# Patient Record
Sex: Male | Born: 1970 | Race: White | Hispanic: No | Marital: Married | Smoking: Never smoker
Health system: Southern US, Community
[De-identification: ages and names within clinical notes are randomized; demographics above are authoritative.]

## PROBLEM LIST (undated history)

## (undated) DIAGNOSIS — K219 Gastro-esophageal reflux disease without esophagitis: Secondary | ICD-10-CM

## (undated) DIAGNOSIS — Z8489 Family history of other specified conditions: Secondary | ICD-10-CM

## (undated) DIAGNOSIS — F32A Depression, unspecified: Secondary | ICD-10-CM

## (undated) DIAGNOSIS — T7840XA Allergy, unspecified, initial encounter: Secondary | ICD-10-CM

## (undated) DIAGNOSIS — K5792 Diverticulitis of intestine, part unspecified, without perforation or abscess without bleeding: Secondary | ICD-10-CM

## (undated) DIAGNOSIS — F419 Anxiety disorder, unspecified: Secondary | ICD-10-CM

## (undated) HISTORY — DX: Anxiety disorder, unspecified: F41.9

## (undated) HISTORY — PX: OTHER SURGICAL HISTORY: SHX169

## (undated) HISTORY — DX: Gastro-esophageal reflux disease without esophagitis: K21.9

## (undated) HISTORY — PX: COLONOSCOPY: SHX174

## (undated) HISTORY — DX: Allergy, unspecified, initial encounter: T78.40XA

---

## 2004-04-05 DIAGNOSIS — K589 Irritable bowel syndrome without diarrhea: Secondary | ICD-10-CM | POA: Insufficient documentation

## 2007-07-31 ENCOUNTER — Emergency Department (HOSPITAL_BASED_OUTPATIENT_CLINIC_OR_DEPARTMENT_OTHER): Admission: EM | Admit: 2007-07-31 | Discharge: 2007-07-31 | Payer: Self-pay | Admitting: Emergency Medicine

## 2008-10-03 ENCOUNTER — Encounter: Admission: RE | Admit: 2008-10-03 | Discharge: 2008-10-03 | Payer: Self-pay | Admitting: Family Medicine

## 2009-09-15 ENCOUNTER — Emergency Department (HOSPITAL_BASED_OUTPATIENT_CLINIC_OR_DEPARTMENT_OTHER): Admission: EM | Admit: 2009-09-15 | Discharge: 2009-09-16 | Payer: Self-pay | Admitting: Emergency Medicine

## 2009-09-16 ENCOUNTER — Ambulatory Visit: Payer: Self-pay | Admitting: Diagnostic Radiology

## 2010-04-16 LAB — DIFFERENTIAL
Basophils Relative: 3 % — ABNORMAL HIGH (ref 0–1)
Eosinophils Relative: 1 % (ref 0–5)
Lymphocytes Relative: 22 % (ref 12–46)
Monocytes Absolute: 0.5 10*3/uL (ref 0.1–1.0)
Monocytes Relative: 8 % (ref 3–12)
Neutrophils Relative %: 66 % (ref 43–77)

## 2010-04-16 LAB — BASIC METABOLIC PANEL
Creatinine, Ser: 1.1 mg/dL (ref 0.4–1.5)
GFR calc Af Amer: >60 mL/min (ref >60)
GFR calc non Af Amer: >60 mL/min (ref >60)
Sodium: 143 mEq/L (ref 135–145)

## 2010-04-16 LAB — CBC
MCH: 32.6 pg (ref 26.0–34.0)
RBC: 4.68 MIL/uL (ref 4.22–5.81)

## 2010-10-28 LAB — URINALYSIS, ROUTINE W REFLEX MICROSCOPIC
Glucose, UA: NEGATIVE
Ketones, ur: NEGATIVE
Nitrite: NEGATIVE
Protein, ur: NEGATIVE
Specific Gravity, Urine: 1.009
pH: 6

## 2011-09-20 ENCOUNTER — Encounter (HOSPITAL_BASED_OUTPATIENT_CLINIC_OR_DEPARTMENT_OTHER): Payer: Self-pay | Admitting: Emergency Medicine

## 2011-09-20 ENCOUNTER — Emergency Department (HOSPITAL_COMMUNITY): Payer: BC Managed Care – PPO

## 2011-09-20 ENCOUNTER — Emergency Department (HOSPITAL_BASED_OUTPATIENT_CLINIC_OR_DEPARTMENT_OTHER)
Admission: EM | Admit: 2011-09-20 | Discharge: 2011-09-20 | Disposition: A | Discharge status: Home / Self Care | Payer: BC Managed Care – PPO | Attending: Emergency Medicine | Admitting: Emergency Medicine

## 2011-09-20 ENCOUNTER — Emergency Department (HOSPITAL_BASED_OUTPATIENT_CLINIC_OR_DEPARTMENT_OTHER): Payer: BC Managed Care – PPO

## 2011-09-20 DIAGNOSIS — Z8719 Personal history of other diseases of the digestive system: Secondary | ICD-10-CM | POA: Insufficient documentation

## 2011-09-20 DIAGNOSIS — R209 Unspecified disturbances of skin sensation: Secondary | ICD-10-CM | POA: Insufficient documentation

## 2011-09-20 DIAGNOSIS — R079 Chest pain, unspecified: Secondary | ICD-10-CM | POA: Insufficient documentation

## 2011-09-20 HISTORY — DX: Diverticulitis of intestine, part unspecified, without perforation or abscess without bleeding: K57.92

## 2011-09-20 LAB — CBC WITH DIFFERENTIAL/PLATELET
Basophils Absolute: 0 10*3/uL (ref 0.0–0.1)
Eosinophils Absolute: 0 10*3/uL (ref 0.0–0.7)
Eosinophils Relative: 1 % (ref 0–5)
HCT: 42.8 % (ref 39.0–52.0)
MCH: 31.4 pg (ref 26.0–34.0)
Monocytes Absolute: 0.6 10*3/uL (ref 0.1–1.0)
Monocytes Relative: 12 % (ref 3–12)
Platelets: 272 10*3/uL (ref 150–400)
RDW: 12.6 % (ref 11.5–15.5)
WBC: 5 10*3/uL (ref 4.0–10.5)

## 2011-09-20 LAB — COMPREHENSIVE METABOLIC PANEL
ALT: 26 U/L (ref 0–53)
AST: 22 U/L (ref 0–37)
BUN: 13 mg/dL (ref 6–23)
CO2: 27 mEq/L (ref 19–32)
Calcium: 9.6 mg/dL (ref 8.4–10.5)
Chloride: 104 mEq/L (ref 96–112)
Creatinine, Ser: 0.9 mg/dL (ref 0.50–1.35)
Potassium: 4 mEq/L (ref 3.5–5.1)

## 2011-09-20 LAB — TROPONIN I: Troponin I: <0.3 ng/mL (ref <0.30)

## 2011-09-20 LAB — POCT I-STAT TROPONIN I

## 2011-09-20 MED ORDER — NITROGLYCERIN 0.4 MG SL SUBL
0.4000 mg | SUBLINGUAL_TABLET | SUBLINGUAL | Status: DC | PRN
  Administered 2011-09-20: 0.4 mg via SUBLINGUAL
  Filled 2011-09-20: qty 25

## 2011-09-20 MED ORDER — METOPROLOL TARTRATE 1 MG/ML IV SOLN
INTRAVENOUS | Status: AC
  Filled 2011-09-20: qty 10

## 2011-09-20 MED ORDER — METOPROLOL TARTRATE 1 MG/ML IV SOLN
2.5000 mg | Freq: Once | INTRAVENOUS | Status: AC
  Administered 2011-09-20: 2.5 mg via INTRAVENOUS

## 2011-09-20 MED ORDER — NITROGLYCERIN 0.4 MG SL SUBL
0.4000 mg | SUBLINGUAL_TABLET | Freq: Once | SUBLINGUAL | Status: AC
  Administered 2011-09-20: 0.4 mg via SUBLINGUAL

## 2011-09-20 MED ORDER — METOPROLOL TARTRATE 50 MG PO TABS
50.0000 mg | ORAL_TABLET | Freq: Once | ORAL | Status: AC
  Administered 2011-09-20: 50 mg via ORAL
  Filled 2011-09-20: qty 1

## 2011-09-20 MED ORDER — NITROGLYCERIN 0.4 MG SL SUBL
SUBLINGUAL_TABLET | SUBLINGUAL | Status: AC
  Administered 2011-09-20: 0.4 mg via SUBLINGUAL
  Filled 2011-09-20: qty 25

## 2011-09-20 MED ORDER — IOHEXOL 350 MG/ML SOLN
80.0000 mL | Freq: Once | INTRAVENOUS | Status: AC | PRN
  Administered 2011-09-20: 80 mL via INTRAVENOUS

## 2011-09-20 NOTE — ED Provider Notes (Signed)
History     CSN: [id number]  Arrival date & time 09/20/11  5784   First MD Initiated Contact with Patient 09/20/11 [phone]      Chief Complaint  Patient presents with  . Chest Pain    (Consider location/radiation/quality/duration/timing/severity/associated sxs/prior treatment) HPI  Past Medical History  Diagnosis Date  . Diverticulitis     No past surgical history on file.  No family history on file.  History  Substance Use Topics  . Smoking status: Never Smoker   . Smokeless tobacco: Not on file  . Alcohol Use:      social      Review of Systems  Allergies  Review of patient's allergies indicates no known allergies.  Home Medications  No current outpatient prescriptions on file.  BP 118/73  Pulse 76  Temp 98.1 F (36.7 C) (Oral)  Resp 18  Ht 5\' 8"  (1.727 m)  Wt 145 lb 5 oz (65.913 kg)  BMI 22.09 kg/m2  SpO2 100%  Physical Exam  ED Course  Procedures (including critical care time)   Labs Reviewed  CBC WITH DIFFERENTIAL  COMPREHENSIVE METABOLIC PANEL  TROPONIN I  POCT I-STAT TROPONIN I   Dg Chest 2 View  09/20/2011  *RADIOLOGY REPORT*  Clinical Data: Left-sided chest pain.  CHEST - 2 VIEW  Comparison: 09/16/2009.  Findings: The lungs are clear.  There is mild hyperinflation.  The heart and mediastinal structures are normal.  The osseous structures are unremarkable.  IMPRESSION: Mild hyperinflation.  No acute findings.   Original Report Authenticated By: Rolla Plate, M.D.    Ct Heart Morp W/cta Cor W/score W/ca W/cm &/or Wo/cm  09/20/2011  *RADIOLOGY REPORT*  INDICATION:  Left chest pain.  CT ANGIOGRAPHY OF THE HEART, CORONARY ARTERY, STRUCTURE, AND MORPHOLOGY  CONTRAST: 80mL OMNIPAQUE IOHEXOL 350 MG/ML SOLN  COMPARISON:  Chest CT 09/16/2009  TECHNIQUE:  CT angiography of the coronary vessels was performed on a 256 channel system using prospective ECG gating.  A scout and noncontrast exam (for calcium scoring) were performed.  Circulation time was  measured using a test bolus.  Coronary CTA was performed with sub mm slice collimation during portions of the cardiac cycle after prior injection of iodinated contrast.  Imaging post processing was performed on an independent workstation creating multiplanar and 3-D images, and quantitative analysis of the heart and coronary arteries.  Note that this exam targets the heart and the chest was not imaged in its entirety.  PREMEDICATION: Lopressor 50 mg, P.O. Lopressor 2.5 mg, IV Nitroglycerin 0.4 mcg, sublingual.  FINDINGS: Technical quality:  Adequate  Heart rate:  67  CORONARY ARTERIES: Left main coronary artery:  Short vessel of average caliber.  No intrinsic disease or stenosis. Left anterior descending:  Gives rise to septal perforator branches, a small first diagonal and dominant second diagonal branch.  No intrinsic disease or stenosis. Left circumflex:  Give rise to a single moderate-sized obtuse marginal branch, posterior lateral left ventricular branch and posterior descending artery.  No intrinsic disease or stenosis. Right coronary artery:  Small caliber vessel.  No intrinsic disease or stenosis.  Gives rise to a small conus branch and SA nodal branch, moderate sized acute marginal branch and continues as a diminutive right coronary artery.  No intrinsic disease or stenosis. Posterior descending artery:  Arises from the left circumflex artery.  No intrinsic disease or stenosis. Dominance:  Left  CORONARY CALCIUM: Total Agatston Score:  Zero MESA database percentile:  Zero  CARDIAC MEASUREMENTS:  Interventricular septum (  6 - 12 mm):  7 mm LV posterior wall (6 - 12 mm):  8 mm LV diameter in diastole (35 - 52 mm):  42 mm  AORTA AND PULMONARY MEASUREMENTS: Aortic root (21 - 40 mm):             27 mm  at the annulus             36 mm  at the sinuses of Valsalva             24 mm  at the sinotubular junction Ascending aorta ( <  40 mm):  26 mm Descending aorta ( <  40 mm):  18 mm Main pulmonary artery:  ( <  30  mm):  23 mm  EXTRACARDIAC FINDINGS: No adenopathy in the visualized lower mediastinum or hila bilaterally.  Visualized lung fields are clear.  No pleural effusions.  IMPRESSION:  1.  Negative for coronary artery disease.  The patient's total coronary artery calcium score is zero, which is zero percentile for patient's matched age and gender. 2.  No significant extracardiac abnormality 3.  Left coronary artery dominance.  Report was called to Josh at 2:50 p.m. on 09/20/2011.   Original Report Authenticated By: Cyndie Chime, M.D.      1. Chest pain    Patient discussed with Dr. Rosalia Hammers prior to leaving [facility]. Metoprolol ordered there in anticipation of coronary CT.   1:27 PM Patient seen and examined. No active CP.   Vital signs reviewed and are as follows: Filed Vitals:   09/20/11 1220  BP:   Pulse: 76  Temp:   Resp:   BP 106/65  Pulse 54  Temp 98.3 F (36.8 C) (Oral)  Resp 14  Ht 5\' 8"  (1.727 m)  Wt 145 lb 5 oz (65.913 kg)  BMI 22.09 kg/m2  SpO2 97%    1:28 PM Exam:  Gen NAD; Heart bradycardia 55, reg rhythm, nml S1,S2, no m/r/g; Lungs CTAB; Abd soft, NT, no rebound or guarding; Ext 2+ pedal pulses bilaterally, no edema.  3:06 PM Clean coronary arteries per Dr. Kearney Hard. Calcium score 0. Patient informed, will discharge home. [facility]CP [facility]. Urged follow-up.   Patient was counseled to return with severe chest pain, especially if the pain is crushing or pressure-like and spreads to the arms, back, neck, or jaw, or if they have sweating, nausea, or shortness of breath with the pain. They were encouraged to call 911 with these symptoms.   They were also told to return if their chest pain gets worse and does not go away with rest, they have an attack of chest pain lasting longer than usual despite rest and treatment with the medications their caregiver has prescribed, if they wake from sleep with chest pain or shortness of breath, if they feel dizzy or faint, if they have chest  pain not typical of their usual pain, or if they have any other emergent concerns regarding their health.  The patient verbalized understanding and agreed.   MDM  CPP -- coronary CT clear. D/c to home with PCP follow-up for pain.         [place], [place] 09/20/11 [phone]

## 2011-09-20 NOTE — ED Notes (Signed)
MD informed of response to nitro

## 2011-09-20 NOTE — ED Notes (Signed)
Called ct after dr Reche Dixon made aware. Tech advises they are in a procedure and will have to call back.

## 2011-09-20 NOTE — ED Notes (Signed)
PATIENT HAS ARRIVED VIA CARE LINK FROM [facility]

## 2011-09-20 NOTE — ED Notes (Signed)
C/o of left sided chest pain that radiates to lt arm x 4 days. Worse this am

## 2011-09-20 NOTE — ED Notes (Signed)
Fluid bolus- pt remains awake and alert/hob down

## 2011-09-20 NOTE — ED Provider Notes (Addendum)
History     CSN: [id number]  Arrival date & time 09/20/11  8657   First MD Initiated Contact with Patient 09/20/11 [phone]      Chief Complaint  Patient presents with  . Chest Pain    (Consider location/radiation/quality/duration/timing/severity/associated sxs/prior treatment) HPI Patient complaining of left lower chest pain began four days ago pulling sensation massages without relief.  Also feels tingling in left arm.  Pain 4/10.  Comes and goes until today and more constant.  Feels lightheaded and legs weak.  Denies dyspnea, diaphoresis, nausea or vomiting.  Pain present now at 4/10.  Took aspirin without relief.   Past Medical History  Diagnosis Date  . Diverticulitis     No past surgical history on file.  No family history on file.  History  Substance Use Topics  . Smoking status: Never Smoker   . Smokeless tobacco: Not on file  . Alcohol Use:      social      Review of Systems  All other systems reviewed and are negative.    Allergies  Review of patient's allergies indicates no known allergies.  Home Medications  No current outpatient prescriptions on file.  BP 124/79  Pulse 63  Temp 98.1 F (36.7 C) (Oral)  Resp 18  SpO2 99%  Physical Exam  Nursing note and vitals reviewed. Constitutional: He is oriented to person, place, and time. He appears well-developed and well-nourished.  HENT:  Head: Normocephalic and atraumatic.  Right Ear: External ear normal.  Left Ear: External ear normal.  Nose: Nose normal.  Mouth/Throat: Oropharynx is clear and moist.  Eyes: Conjunctivae and EOM are normal. Pupils are equal, round, and reactive to light.  Neck: Normal range of motion. Neck supple.  Cardiovascular: Normal rate, regular rhythm, normal heart sounds and intact distal pulses.   Pulmonary/Chest: Effort normal and breath sounds normal.  Abdominal: Soft. Bowel sounds are normal.  Musculoskeletal: Normal range of motion.  Neurological: He is alert and  oriented to person, place, and time. He has normal reflexes.  Skin: Skin is warm and dry.  Psychiatric: He has a normal mood and affect. His behavior is normal. Thought content normal.    ED Course  Procedures (including critical care time)  Labs Reviewed - No data to display No results found.   No diagnosis found.   Date: 09/20/2011  Rate: 64  Rhythm: normal sinus rhythm  QRS Axis: normal  Intervals: normal  ST/T Wave abnormalities: normal  Conduction Disutrbances: none  Narrative Interpretation: unremarkable      MDM  Patient with pain at 0 except occasionally with inspiration to a 1 after 2 sublingual nitroglycerin. EKG is normal and cardiac markers are negative. Patient's care discussed with Dr. Gustavus Messing and Rhea Bleacher plan transfer patient to [facility] CDU to have chest pain protocol done.        Hilario Quarry, MD 09/20/11 1158  Hilario Quarry, MD 09/20/11 1159

## 2011-09-20 NOTE — ED Notes (Signed)
PT BMI IS 22.1

## 2011-09-21 NOTE — ED Provider Notes (Signed)
Medical screening examination/treatment/procedure(s) were conducted as a shared visit with non-physician practitioner(s) and myself.  I personally evaluated the patient during the encounter  Sent from [facility], for Chest Pain protocol in CDU. Case discuseed with Dr Rosalia Hammers prior to arrival and PA upon arrival. Patient seen and obs protocol appropriate.   Shelda Jakes, MD 09/21/11 2006

## 2016-05-30 ENCOUNTER — Encounter (HOSPITAL_BASED_OUTPATIENT_CLINIC_OR_DEPARTMENT_OTHER): Payer: Self-pay

## 2016-05-30 ENCOUNTER — Emergency Department (HOSPITAL_BASED_OUTPATIENT_CLINIC_OR_DEPARTMENT_OTHER)
Admission: EM | Admit: 2016-05-30 | Discharge: 2016-05-30 | Disposition: A | Discharge status: Home / Self Care | Payer: BLUE CROSS/BLUE SHIELD | Attending: Emergency Medicine | Admitting: Emergency Medicine

## 2016-05-30 DIAGNOSIS — M7989 Other specified soft tissue disorders: Secondary | ICD-10-CM

## 2016-05-30 DIAGNOSIS — Y939 Activity, unspecified: Secondary | ICD-10-CM | POA: Insufficient documentation

## 2016-05-30 DIAGNOSIS — S60416A Abrasion of right little finger, initial encounter: Secondary | ICD-10-CM | POA: Diagnosis not present

## 2016-05-30 DIAGNOSIS — S6991XA Unspecified injury of right wrist, hand and finger(s), initial encounter: Secondary | ICD-10-CM | POA: Diagnosis present

## 2016-05-30 DIAGNOSIS — W4904XA Ring or other jewelry causing external constriction, initial encounter: Secondary | ICD-10-CM | POA: Insufficient documentation

## 2016-05-30 DIAGNOSIS — Y999 Unspecified external cause status: Secondary | ICD-10-CM | POA: Insufficient documentation

## 2016-05-30 DIAGNOSIS — Y929 Unspecified place or not applicable: Secondary | ICD-10-CM | POA: Diagnosis not present

## 2016-05-30 NOTE — ED Triage Notes (Signed)
Pt with ring stuck right pinky finger x today-ice pack in place upon arrival-finger is reddish blue in color-difficult to assess due to ice pack has been in place approx 20 min

## 2016-05-30 NOTE — ED Notes (Signed)
ED Provider at bedside. 

## 2016-05-30 NOTE — ED Notes (Signed)
John, EMT at bedside for ring removal.

## 2016-05-30 NOTE — ED Provider Notes (Signed)
  [facility]-EMERGENCY DEPT Provider Note   CSN: [id number] Arrival date & time: 05/30/16  1210     History   Chief Complaint No chief complaint on file.   HPI Edward Carpenter is a 45 y.o. male.   Hand Pain  This is a new problem. The current episode started 1 to 2 hours ago. The problem occurs constantly. Nothing aggravates the symptoms. Nothing relieves the symptoms. He has tried nothing for the symptoms. The treatment provided no relief.    Past Medical History:  Diagnosis Date  . Diverticulitis     There are no active problems to display for this patient.   History reviewed. No pertinent surgical history.     Home Medications    Prior to Admission medications   Not on File    Family History No family history on file.  Social History Social History  Substance Use Topics  . Smoking status: Never Smoker  . Smokeless tobacco: Never Used  . Alcohol use No     Allergies   Patient has no known allergies.   Review of Systems Review of Systems  All other systems reviewed and are negative.    Physical Exam Updated Vital Signs BP 124/80 (BP Location: Left Arm)   Pulse 88   Temp 98.1 F (36.7 C) (Oral)   Resp 16   Ht  (1.727 m)   Wt 165 lb 5 oz (75 kg)   SpO2 99%   BMI 25.14 kg/m   Physical Exam  Constitutional: He appears well-developed and well-nourished.  HENT:  Head: Normocephalic and atraumatic.  Eyes: Conjunctivae and EOM are normal.  Neck: Normal range of motion.  Cardiovascular: Normal rate.   Pulmonary/Chest: Effort normal. No respiratory distress.  Abdominal: Soft. He exhibits no distension.  Musculoskeletal: Normal range of motion.  Right hand, fifth finger swollen, cold, blue distal to ring after patient put ice on it and attempted to cut it off at home  Neurological: He is alert.  Skin: Skin is warm and dry.  Nursing note and vitals reviewed.    ED Treatments / Results  Labs (all labs ordered are listed, but only abnormal  results are displayed) Labs Reviewed - No data to display  EKG  EKG Interpretation None       Radiology No results found.  Procedures Procedures (including critical care time)  Medications Ordered in ED Medications - No data to display   Initial Impression / Assessment and Plan / ED Course  I have reviewed the triage vital signs and the nursing notes.  Pertinent labs & imaging results that were available during my care of the patient were reviewed by me and considered in my medical decision making (see chart for details).    Will remove ring.   Ring removed. NVI afterwards. Small abrasion but no other injuries.  Final Clinical Impressions(s) / ED Diagnoses   Final diagnoses:  Swollen finger    New Prescriptions There are no discharge medications for this patient.    Marily Memos, MD 05/31/16 [phone]

## 2017-05-24 DIAGNOSIS — K219 Gastro-esophageal reflux disease without esophagitis: Secondary | ICD-10-CM | POA: Diagnosis not present

## 2017-05-24 DIAGNOSIS — Z8719 Personal history of other diseases of the digestive system: Secondary | ICD-10-CM | POA: Diagnosis not present

## 2017-05-24 DIAGNOSIS — R1032 Left lower quadrant pain: Secondary | ICD-10-CM | POA: Diagnosis not present

## 2017-05-24 DIAGNOSIS — R194 Change in bowel habit: Secondary | ICD-10-CM | POA: Diagnosis not present

## 2017-06-23 DIAGNOSIS — R1032 Left lower quadrant pain: Secondary | ICD-10-CM | POA: Diagnosis not present

## 2017-06-23 DIAGNOSIS — K2930 Chronic superficial gastritis without bleeding: Secondary | ICD-10-CM | POA: Diagnosis not present

## 2017-06-23 DIAGNOSIS — K210 Gastro-esophageal reflux disease with esophagitis: Secondary | ICD-10-CM | POA: Diagnosis not present

## 2017-06-23 DIAGNOSIS — K5289 Other specified noninfective gastroenteritis and colitis: Secondary | ICD-10-CM | POA: Diagnosis not present

## 2017-06-23 DIAGNOSIS — K648 Other hemorrhoids: Secondary | ICD-10-CM | POA: Diagnosis not present

## 2017-06-23 DIAGNOSIS — R194 Change in bowel habit: Secondary | ICD-10-CM | POA: Diagnosis not present

## 2017-06-23 DIAGNOSIS — K529 Noninfective gastroenteritis and colitis, unspecified: Secondary | ICD-10-CM | POA: Diagnosis not present

## 2017-06-23 DIAGNOSIS — K5730 Diverticulosis of large intestine without perforation or abscess without bleeding: Secondary | ICD-10-CM | POA: Diagnosis not present

## 2017-06-29 DIAGNOSIS — K210 Gastro-esophageal reflux disease with esophagitis: Secondary | ICD-10-CM | POA: Diagnosis not present

## 2017-06-29 DIAGNOSIS — K529 Noninfective gastroenteritis and colitis, unspecified: Secondary | ICD-10-CM | POA: Diagnosis not present

## 2017-06-29 DIAGNOSIS — K2930 Chronic superficial gastritis without bleeding: Secondary | ICD-10-CM | POA: Diagnosis not present

## 2017-07-18 ENCOUNTER — Other Ambulatory Visit: Payer: Self-pay | Admitting: Gastroenterology

## 2017-07-18 DIAGNOSIS — Z8719 Personal history of other diseases of the digestive system: Secondary | ICD-10-CM

## 2017-07-18 DIAGNOSIS — R1032 Left lower quadrant pain: Secondary | ICD-10-CM

## 2017-08-07 ENCOUNTER — Ambulatory Visit
Admission: RE | Admit: 2017-08-07 | Discharge: 2017-08-07 | Disposition: A | Discharge status: Home / Self Care | Payer: BLUE CROSS/BLUE SHIELD | Source: Ambulatory Visit | Attending: Gastroenterology | Admitting: Gastroenterology

## 2017-08-07 ENCOUNTER — Encounter: Payer: Self-pay | Admitting: Radiology

## 2017-08-07 DIAGNOSIS — K5730 Diverticulosis of large intestine without perforation or abscess without bleeding: Secondary | ICD-10-CM | POA: Diagnosis not present

## 2017-08-07 DIAGNOSIS — R1032 Left lower quadrant pain: Secondary | ICD-10-CM

## 2017-08-07 DIAGNOSIS — Z8719 Personal history of other diseases of the digestive system: Secondary | ICD-10-CM

## 2017-08-07 MED ORDER — IOPAMIDOL (ISOVUE-300) INJECTION 61%
100.0000 mL | Freq: Once | INTRAVENOUS | Status: AC | PRN
  Administered 2017-08-07: 100 mL via INTRAVENOUS

## 2017-08-21 DIAGNOSIS — K219 Gastro-esophageal reflux disease without esophagitis: Secondary | ICD-10-CM | POA: Diagnosis not present

## 2017-08-21 DIAGNOSIS — R1032 Left lower quadrant pain: Secondary | ICD-10-CM | POA: Diagnosis not present

## 2018-08-22 DIAGNOSIS — J3489 Other specified disorders of nose and nasal sinuses: Secondary | ICD-10-CM | POA: Diagnosis not present

## 2018-08-22 DIAGNOSIS — J029 Acute pharyngitis, unspecified: Secondary | ICD-10-CM | POA: Diagnosis not present

## 2018-12-18 DIAGNOSIS — Z8719 Personal history of other diseases of the digestive system: Secondary | ICD-10-CM | POA: Diagnosis not present

## 2018-12-18 DIAGNOSIS — R1032 Left lower quadrant pain: Secondary | ICD-10-CM | POA: Diagnosis not present

## 2018-12-18 DIAGNOSIS — R9389 Abnormal findings on diagnostic imaging of other specified body structures: Secondary | ICD-10-CM | POA: Diagnosis not present

## 2019-06-21 DIAGNOSIS — K219 Gastro-esophageal reflux disease without esophagitis: Secondary | ICD-10-CM | POA: Diagnosis not present

## 2019-06-21 DIAGNOSIS — R05 Cough: Secondary | ICD-10-CM | POA: Diagnosis not present

## 2019-06-21 DIAGNOSIS — R0602 Shortness of breath: Secondary | ICD-10-CM | POA: Diagnosis not present

## 2019-07-01 ENCOUNTER — Encounter (HOSPITAL_BASED_OUTPATIENT_CLINIC_OR_DEPARTMENT_OTHER): Payer: Self-pay | Admitting: *Deleted

## 2019-07-01 ENCOUNTER — Emergency Department (HOSPITAL_BASED_OUTPATIENT_CLINIC_OR_DEPARTMENT_OTHER): Payer: BC Managed Care – PPO

## 2019-07-01 ENCOUNTER — Emergency Department (HOSPITAL_BASED_OUTPATIENT_CLINIC_OR_DEPARTMENT_OTHER)
Admission: EM | Admit: 2019-07-01 | Discharge: 2019-07-02 | Disposition: A | Discharge status: Home / Self Care | Payer: BC Managed Care – PPO | Attending: Emergency Medicine | Admitting: Emergency Medicine

## 2019-07-01 ENCOUNTER — Other Ambulatory Visit: Payer: Self-pay

## 2019-07-01 DIAGNOSIS — R079 Chest pain, unspecified: Secondary | ICD-10-CM | POA: Insufficient documentation

## 2019-07-01 DIAGNOSIS — R0602 Shortness of breath: Secondary | ICD-10-CM | POA: Diagnosis not present

## 2019-07-01 DIAGNOSIS — R55 Syncope and collapse: Secondary | ICD-10-CM | POA: Insufficient documentation

## 2019-07-01 LAB — CBC WITH DIFFERENTIAL/PLATELET
Abs Immature Granulocytes: 0.15 10*3/uL — ABNORMAL HIGH (ref 0.00–0.07)
Basophils Absolute: 0 10*3/uL (ref 0.0–0.1)
Basophils Relative: 0 %
Eosinophils Absolute: 0 10*3/uL (ref 0.0–0.5)
Eosinophils Relative: 0 %
HCT: 44.9 % (ref 39.0–52.0)
Hemoglobin: 15.7 g/dL (ref 13.0–17.0)
Immature Granulocytes: 1 %
Lymphocytes Relative: 10 %
Lymphs Abs: 1.1 10*3/uL (ref 0.7–4.0)
MCH: 32.4 pg (ref 26.0–34.0)
MCHC: 35 g/dL (ref 30.0–36.0)
MCV: 92.6 fL (ref 80.0–100.0)
Monocytes Absolute: 0.9 10*3/uL (ref 0.1–1.0)
Monocytes Relative: 9 %
Neutro Abs: 8.6 10*3/uL — ABNORMAL HIGH (ref 1.7–7.7)
Neutrophils Relative %: 80 %
Platelets: 358 10*3/uL (ref 150–400)
RBC: 4.85 MIL/uL (ref 4.22–5.81)
RDW: 13 % (ref 11.5–15.5)
WBC: 10.8 10*3/uL — ABNORMAL HIGH (ref 4.0–10.5)
nRBC: 0 % (ref 0.0–0.2)

## 2019-07-01 LAB — COMPREHENSIVE METABOLIC PANEL
ALT: 32 U/L (ref 0–44)
AST: 19 U/L (ref 15–41)
Albumin: 4.2 g/dL (ref 3.5–5.0)
Alkaline Phosphatase: 54 U/L (ref 38–126)
Anion gap: 10 (ref 5–15)
BUN: 23 mg/dL — ABNORMAL HIGH (ref 6–20)
CO2: 25 mmol/L (ref 22–32)
Calcium: 9.1 mg/dL (ref 8.9–10.3)
Chloride: 103 mmol/L (ref 98–111)
Creatinine, Ser: 1.23 mg/dL (ref 0.61–1.24)
GFR calc Af Amer: >60 mL/min (ref >60)
GFR calc non Af Amer: >60 mL/min (ref >60)
Glucose, Bld: 124 mg/dL — ABNORMAL HIGH (ref 70–99)
Potassium: 4.3 mmol/L (ref 3.5–5.1)
Sodium: 138 mmol/L (ref 135–145)
Total Bilirubin: 0.7 mg/dL (ref 0.3–1.2)
Total Protein: 7.1 g/dL (ref 6.5–8.1)

## 2019-07-01 LAB — BRAIN NATRIURETIC PEPTIDE: B Natriuretic Peptide: 19.9 pg/mL (ref 0.0–100.0)

## 2019-07-01 LAB — TROPONIN I (HIGH SENSITIVITY): Troponin I (High Sensitivity): <2 ng/L (ref <18)

## 2019-07-01 MED ORDER — SODIUM CHLORIDE 0.9 % IV BOLUS
1000.0000 mL | Freq: Once | INTRAVENOUS | Status: AC
  Administered 2019-07-01: 1000 mL via INTRAVENOUS

## 2019-07-01 MED ORDER — IOHEXOL 350 MG/ML SOLN
100.0000 mL | Freq: Once | INTRAVENOUS | Status: AC | PRN
  Administered 2019-07-01: 100 mL via INTRAVENOUS

## 2019-07-01 NOTE — ED Provider Notes (Signed)
10:54 PM Assumed care from Dr. Adela Lank, please see their note for full history, physical and decision making until this point. In brief this is a 49 y.o. year old male who presented to the ED tonight with Loss of Consciousness     Recent covid. Here with a couple weeks of CP and syncopized today. Pending troponins and PE study.   On reevaluation patient has been persistently in a sinus rhythm with no evidence of arrhythmias on the monitor.  He still having some pleuritic pain consistent likely pleurisy.  I offered steroids to him he really does do anti-inflammatories at this point.  CT scan was negative for PE, consolidations, atypical pneumonia or any other concerning symptoms.  Patient states he did not actually have Covid but was just tested for it.  At this time I did have a clear cause for his syncopal episode however it feels like life-threatening possibilities have been pretty well evaluated without.  We will follow his PCP for further work-up of less emergent causes. All questions answered. Stable for discharge.   Discharge instructions, including strict return precautions for new or worsening symptoms, given. Patient and/or family verbalized understanding and agreement with the plan as described.   Labs, studies and imaging reviewed by myself and considered in medical decision making if ordered. Imaging interpreted by radiology.  Labs Reviewed  CBC WITH DIFFERENTIAL/PLATELET - Abnormal; Notable for the following components:      Result Value   WBC 10.8 (*)    Neutro Abs 8.6 (*)    Abs Immature Granulocytes 0.15 (*)    All other components within normal limits  COMPREHENSIVE METABOLIC PANEL - Abnormal; Notable for the following components:   Glucose, Bld 124 (*)    BUN 23 (*)    All other components within normal limits  BRAIN NATRIURETIC PEPTIDE  TROPONIN I (HIGH SENSITIVITY)    CT Angio Chest PE W and/or Wo Contrast    (Results Pending)    No follow-ups on file.    Teryn Gust,  Barbara Cower, MD 07/02/19 [phone]

## 2019-07-01 NOTE — ED Provider Notes (Signed)
[facility] EMERGENCY DEPARTMENT Provider Note   CSN: [id number] Arrival date & time: 07/01/19  2124     History Chief Complaint  Patient presents with  . Loss of Consciousness    Edward Carpenter is a 49 y.o. male.  49 yo M with a chief complaints of a syncopal event.  Patient was eating dinner and watching TV and he woke up on the ground.  His wife said that he spontaneously lost consciousness.  He has been having chest pain and shortness of breath for about 3 weeks now.  Having a cough with it.  Feels that the coughing makes the symptoms much worse.  Denies history of MI denies history of PE or DVT.  Scribes the pain is an uncomfortable feeling in the center of his chest worse with coughing.  Denies hemoptysis.  The history is provided by the patient.  Loss of Consciousness Episode history:  Single Most recent episode:  Today Duration:  2 seconds Timing:  Constant Progression:  Worsening Chronicity:  New Witnessed: no   Relieved by:  Nothing Worsened by:  Nothing Ineffective treatments:  None tried Associated symptoms: chest pain and shortness of breath   Associated symptoms: no confusion, no fever, no headaches, no palpitations and no vomiting        Past Medical History:  Diagnosis Date  . Diverticulitis     There are no problems to display for this patient.   History reviewed. No pertinent surgical history.     No family history on file.  Social History   Tobacco Use  . Smoking status: Never Smoker  . Smokeless tobacco: Never Used  Substance Use Topics  . Alcohol use: No  . Drug use: No    Home Medications Prior to Admission medications   Not on File    Allergies    Patient has no known allergies.  Review of Systems   Review of Systems  Constitutional: Negative for chills and fever.  HENT: Negative for congestion and facial swelling.   Eyes: Negative for discharge and visual disturbance.  Respiratory: Positive for shortness of  breath.   Cardiovascular: Positive for chest pain and syncope. Negative for palpitations.  Gastrointestinal: Negative for abdominal pain, diarrhea and vomiting.  Musculoskeletal: Negative for arthralgias and myalgias.  Skin: Negative for color change and rash.  Neurological: Positive for syncope. Negative for tremors and headaches.  Psychiatric/Behavioral: Negative for confusion and dysphoric mood.    Physical Exam Updated Vital Signs BP (!) 135/95   Pulse 77   Temp 98.6 F (37 C) (Oral)   Resp 11   Ht 5\' 8"  (1.727 m)   Wt 72.6 kg   SpO2 98%   BMI 24.33 kg/m   Physical Exam Vitals and nursing note reviewed.  Constitutional:      Appearance: He is well-developed.  HENT:     Head: Normocephalic and atraumatic.  Eyes:     Pupils: Pupils are equal, round, and reactive to light.  Neck:     Vascular: No JVD.  Cardiovascular:     Rate and Rhythm: Normal rate and regular rhythm.     Heart sounds: No murmur. No friction rub. No gallop.   Pulmonary:     Effort: No respiratory distress.     Breath sounds: No wheezing.  Abdominal:     General: There is no distension.     Tenderness: There is no guarding or rebound.  Musculoskeletal:        General: Normal range of  motion.     Cervical back: Normal range of motion and neck supple.  Skin:    Coloration: Skin is not pale.     Findings: No rash.  Neurological:     Mental Status: He is alert and oriented to person, place, and time.  Psychiatric:        Behavior: Behavior normal.     ED Results / Procedures / Treatments   Labs (all labs ordered are listed, but only abnormal results are displayed) Labs Reviewed  CBC WITH DIFFERENTIAL/PLATELET - Abnormal; Notable for the following components:      Result Value   WBC 10.8 (*)    Neutro Abs 8.6 (*)    Abs Immature Granulocytes 0.15 (*)    All other components within normal limits  COMPREHENSIVE METABOLIC PANEL - Abnormal; Notable for the following components:   Glucose, Bld  124 (*)    BUN 23 (*)    All other components within normal limits  BRAIN NATRIURETIC PEPTIDE  TROPONIN I (HIGH SENSITIVITY)    EKG EKG Interpretation  Date/Time:  Monday Jul 01 2019 21:28:40 EDT Ventricular Rate:  97 PR Interval:  120 QRS Duration: 96 QT Interval:  332 QTC Calculation: 421 R Axis:   -13 Text Interpretation: Normal sinus rhythm Normal ECG no wpw, prolonged qt or brugada Otherwise no significant change Confirmed by Deno Etienne [phone]) on 07/01/2019 9:49:51 PM   Radiology No results found.  Procedures Procedures (including critical care time)  Medications Ordered in ED Medications  sodium chloride 0.9 % bolus 1,000 mL (1,000 mLs Intravenous New Bag/Given 07/01/19 2233)  iohexol (OMNIPAQUE) 350 MG/ML injection 100 mL (100 mLs Intravenous Contrast Given 07/01/19 2251)    ED Course  I have reviewed the triage vital signs and the nursing notes.  Pertinent labs & imaging results that were available during my care of the patient were reviewed by me and considered in my medical decision making (see chart for details).    MDM Rules/Calculators/A&P                      49 yo M with a chief complaints of a syncopal event.  He had no prodrome.  Woke up diaphoretic with chest pain and shortness of breath.  He has been having chest pain for about 3 weeks now.  Chest pain in setting of syncope is concerning for pulmonary embolism.  He was tachycardic on my exam in the room.  Will obtain a CT angiogram of the chest.  Lab work bolus of IV fluids reassess.  Initial troponin negative.  No significant electrolyte abnormality.  No significant anemia.  BMP is normal.  Awaiting CT angiogram of the chest and second troponin.  Patient care signed out to Dr. Dayna Barker, please see his note of further details can ED.  The patients results and plan were reviewed and discussed.   Any x-rays performed were independently reviewed by myself.   Differential diagnosis were considered with the  presenting HPI.  Medications  sodium chloride 0.9 % bolus 1,000 mL (1,000 mLs Intravenous New Bag/Given 07/01/19 2233)  iohexol (OMNIPAQUE) 350 MG/ML injection 100 mL (100 mLs Intravenous Contrast Given 07/01/19 2251)    Vitals:   07/01/19 2128 07/01/19 2131 07/01/19 2230  BP:  (!) 143/97 (!) 135/95  Pulse:  86 77  Resp:  20 11  Temp:  98.6 F (37 C)   TempSrc:  Oral   SpO2:  97% 98%  Weight: 72.6 kg  Height: 5\' 8"  (1.727 m)      Final diagnoses:  Syncope and collapse  Chest pain in adult    Admission/ observation were discussed with the admitting physician, patient and/or family and they are comfortable with the plan.   Final Clinical Impression(s) / ED Diagnoses Final diagnoses:  Syncope and collapse  Chest pain in adult    Rx / DC Orders ED Discharge Orders    None       , DO 07/01/19 2303

## 2019-07-01 NOTE — ED Triage Notes (Addendum)
States while eating dinner 40 minutes ago he passed out. He is alert on arrival. Pt states May 11 he got the first Pfizer shot. A week later he had chest tightness and SOB. His MD started him on Prednisone and an antibiotic for lung inflammation.

## 2019-07-01 NOTE — ED Notes (Signed)
Pt. Reporting he passed out earlier  This evening with also reports of having resp. Issues since the first COVID Vaccine by [employer] in early May approx. 3 wks ago.  Pt. Reports he feels short of breath with a cough each time he takes a deep inspiration.  Pt. Has seen his PMD and received abx. And Prednisone for these complaints then tonight he had the syncopal episode and felt chest pain.

## 2019-07-02 LAB — TROPONIN I (HIGH SENSITIVITY): Troponin I (High Sensitivity): <2 ng/L (ref <18)

## 2019-10-21 DIAGNOSIS — Z8719 Personal history of other diseases of the digestive system: Secondary | ICD-10-CM | POA: Diagnosis not present

## 2019-10-21 DIAGNOSIS — K649 Unspecified hemorrhoids: Secondary | ICD-10-CM | POA: Diagnosis not present

## 2019-10-21 DIAGNOSIS — R110 Nausea: Secondary | ICD-10-CM | POA: Diagnosis not present

## 2019-10-21 DIAGNOSIS — K219 Gastro-esophageal reflux disease without esophagitis: Secondary | ICD-10-CM | POA: Diagnosis not present

## 2019-10-22 ENCOUNTER — Other Ambulatory Visit: Payer: Self-pay | Admitting: Gastroenterology

## 2019-10-22 DIAGNOSIS — R110 Nausea: Secondary | ICD-10-CM

## 2019-11-04 ENCOUNTER — Other Ambulatory Visit: Payer: Self-pay

## 2019-11-04 ENCOUNTER — Ambulatory Visit (HOSPITAL_COMMUNITY)
Admission: RE | Admit: 2019-11-04 | Discharge: 2019-11-04 | Disposition: A | Discharge status: Home / Self Care | Payer: BC Managed Care – PPO | Source: Ambulatory Visit | Attending: Gastroenterology | Admitting: Gastroenterology

## 2019-11-04 DIAGNOSIS — R112 Nausea with vomiting, unspecified: Secondary | ICD-10-CM | POA: Diagnosis not present

## 2019-11-04 DIAGNOSIS — R110 Nausea: Secondary | ICD-10-CM | POA: Diagnosis not present

## 2019-11-04 MED ORDER — TECHNETIUM TC 99M MEBROFENIN IV KIT
5.0000 | PACK | Freq: Once | INTRAVENOUS | Status: AC | PRN
  Administered 2019-11-04: 5 via INTRAVENOUS

## 2019-12-30 DIAGNOSIS — R110 Nausea: Secondary | ICD-10-CM | POA: Diagnosis not present

## 2019-12-30 DIAGNOSIS — Z8719 Personal history of other diseases of the digestive system: Secondary | ICD-10-CM | POA: Diagnosis not present

## 2019-12-30 DIAGNOSIS — K219 Gastro-esophageal reflux disease without esophagitis: Secondary | ICD-10-CM | POA: Diagnosis not present

## 2019-12-30 DIAGNOSIS — R109 Unspecified abdominal pain: Secondary | ICD-10-CM | POA: Diagnosis not present

## 2020-03-05 DIAGNOSIS — Z03818 Encounter for observation for suspected exposure to other biological agents ruled out: Secondary | ICD-10-CM | POA: Diagnosis not present

## 2020-03-05 DIAGNOSIS — Z20822 Contact with and (suspected) exposure to covid-19: Secondary | ICD-10-CM | POA: Diagnosis not present

## 2020-07-29 ENCOUNTER — Other Ambulatory Visit: Payer: Self-pay

## 2020-07-29 ENCOUNTER — Telehealth: Payer: Self-pay

## 2020-07-29 ENCOUNTER — Emergency Department (HOSPITAL_BASED_OUTPATIENT_CLINIC_OR_DEPARTMENT_OTHER): Payer: BC Managed Care – PPO

## 2020-07-29 ENCOUNTER — Encounter (HOSPITAL_BASED_OUTPATIENT_CLINIC_OR_DEPARTMENT_OTHER): Payer: Self-pay | Admitting: *Deleted

## 2020-07-29 ENCOUNTER — Emergency Department (HOSPITAL_BASED_OUTPATIENT_CLINIC_OR_DEPARTMENT_OTHER)
Admission: EM | Admit: 2020-07-29 | Discharge: 2020-07-29 | Disposition: A | Discharge status: Home / Self Care | Payer: BC Managed Care – PPO | Attending: Emergency Medicine | Admitting: Emergency Medicine

## 2020-07-29 DIAGNOSIS — M25522 Pain in left elbow: Secondary | ICD-10-CM | POA: Diagnosis not present

## 2020-07-29 DIAGNOSIS — M940 Chondrocostal junction syndrome [Tietze]: Secondary | ICD-10-CM | POA: Insufficient documentation

## 2020-07-29 DIAGNOSIS — R0789 Other chest pain: Secondary | ICD-10-CM | POA: Diagnosis not present

## 2020-07-29 DIAGNOSIS — R079 Chest pain, unspecified: Secondary | ICD-10-CM | POA: Diagnosis not present

## 2020-07-29 LAB — CBC WITH DIFFERENTIAL/PLATELET
Abs Immature Granulocytes: 0.01 10*3/uL (ref 0.00–0.07)
Basophils Absolute: 0 10*3/uL (ref 0.0–0.1)
Basophils Relative: 1 %
Eosinophils Absolute: 0 10*3/uL (ref 0.0–0.5)
Eosinophils Relative: 1 %
HCT: 46.1 % (ref 39.0–52.0)
Hemoglobin: 15.7 g/dL (ref 13.0–17.0)
Immature Granulocytes: 0 %
Lymphocytes Relative: 17 %
Lymphs Abs: 0.9 10*3/uL (ref 0.7–4.0)
MCH: 31.5 pg (ref 26.0–34.0)
MCHC: 34.1 g/dL (ref 30.0–36.0)
MCV: 92.4 fL (ref 80.0–100.0)
Monocytes Absolute: 0.5 10*3/uL (ref 0.1–1.0)
Monocytes Relative: 10 %
Neutro Abs: 3.7 10*3/uL (ref 1.7–7.7)
Neutrophils Relative %: 71 %
Platelets: 328 10*3/uL (ref 150–400)
RBC: 4.99 MIL/uL (ref 4.22–5.81)
RDW: 12.2 % (ref 11.5–15.5)
WBC: 5.2 10*3/uL (ref 4.0–10.5)
nRBC: 0 % (ref 0.0–0.2)

## 2020-07-29 LAB — COMPREHENSIVE METABOLIC PANEL
ALT: 33 U/L (ref 0–44)
AST: 23 U/L (ref 15–41)
Albumin: 4.5 g/dL (ref 3.5–5.0)
Alkaline Phosphatase: 53 U/L (ref 38–126)
Anion gap: 8 (ref 5–15)
BUN: 20 mg/dL (ref 6–20)
CO2: 25 mmol/L (ref 22–32)
Calcium: 9.2 mg/dL (ref 8.9–10.3)
Chloride: 103 mmol/L (ref 98–111)
Creatinine, Ser: 1.09 mg/dL (ref 0.61–1.24)
GFR, Estimated: >60 mL/min (ref >60)
Glucose, Bld: 99 mg/dL (ref 70–99)
Potassium: 4 mmol/L (ref 3.5–5.1)
Sodium: 136 mmol/L (ref 135–145)
Total Bilirubin: 0.9 mg/dL (ref 0.3–1.2)
Total Protein: 7.4 g/dL (ref 6.5–8.1)

## 2020-07-29 LAB — D-DIMER, QUANTITATIVE: D-Dimer, Quant: <0.27 ug/mL-FEU (ref 0.00–0.50)

## 2020-07-29 LAB — TROPONIN I (HIGH SENSITIVITY): Troponin I (High Sensitivity): <2 ng/L (ref <18)

## 2020-07-29 LAB — LIPASE, BLOOD: Lipase: 36 U/L (ref 11–51)

## 2020-07-29 MED ORDER — KETOROLAC TROMETHAMINE 15 MG/ML IJ SOLN
15.0000 mg | Freq: Once | INTRAMUSCULAR | Status: AC
  Administered 2020-07-29: 15 mg via INTRAVENOUS
  Filled 2020-07-29: qty 1

## 2020-07-29 NOTE — Telephone Encounter (Signed)
Patient advised to go to the ED due to his symptoms. He advised he thought it may be due to stress but not sure. He agreed to head over the [facility]. I advised we would keep his appointment as is until tomorrow morning and asked him to call the office ASAP.

## 2020-07-29 NOTE — ED Provider Notes (Signed)
[facility] EMERGENCY DEPARTMENT Provider Note   CSN: [id number] Arrival date & time: 07/29/20  1823     History Chief Complaint  Patient presents with   Chest Pain    Edward Carpenter is a 50 y.o. male.  The history is provided by the patient and medical records.  Chest Pain Edward Carpenter is a 50 y.o. male who presents to the Emergency Department complaining of chest pain. He presents to the ED complaining of central left chest pain that started five days ago.  Pain is constant.  Pain is worse with stress.  At times feels like there is  rubber band in his chest and pain goes the the left elbow. No change with activity.  When the pain is severe he feels like he is going to have a panic attack and he might pass out.    No fever, diaphoresis, cough, sob, abdominal pain, V/D.  When pain is severe has nausea.     No hx/o tobacco, drugs.      Past Medical History:  Diagnosis Date   Diverticulitis     There are no problems to display for this patient.   History reviewed. No pertinent surgical history.     History reviewed. No pertinent family history.  Social History   Tobacco Use   Smoking status: Never   Smokeless tobacco: Never  Substance Use Topics   Alcohol use: No   Drug use: No    Home Medications Prior to Admission medications   Not on File    Allergies    Prednisone  Review of Systems   Review of Systems  Cardiovascular:  Positive for chest pain.  All other systems reviewed and are negative.  Physical Exam Updated Vital Signs BP 119/82 (BP Location: Left Arm)   Pulse 69   Temp 99.4 F (37.4 C) (Oral)   Resp 18   Ht 5\' 8"  (1.727 m)   Wt 72.6 kg   SpO2 100%   BMI 24.33 kg/m   Physical Exam Vitals and nursing note reviewed.  Constitutional:      Appearance: He is well-developed.  HENT:     Head: Normocephalic and atraumatic.  Cardiovascular:     Rate and Rhythm: Normal rate and regular rhythm.     Heart sounds: No murmur  heard. Pulmonary:     Effort: Pulmonary effort is normal. No respiratory distress.     Breath sounds: Normal breath sounds.  Chest:     Chest wall: Tenderness present.  Abdominal:     Palpations: Abdomen is soft.     Tenderness: There is no abdominal tenderness. There is no guarding or rebound.  Musculoskeletal:        General: No swelling or tenderness.  Skin:    General: Skin is warm and dry.  Neurological:     Mental Status: He is alert and oriented to person, place, and time.  Psychiatric:        Behavior: Behavior normal.    ED Results / Procedures / Treatments   Labs (all labs ordered are listed, but only abnormal results are displayed) Labs Reviewed  COMPREHENSIVE METABOLIC PANEL  CBC WITH DIFFERENTIAL/PLATELET  LIPASE, BLOOD  D-DIMER, QUANTITATIVE  TROPONIN I (HIGH SENSITIVITY)    EKG EKG Interpretation  Date/Time:  Wednesday July 29 2020 18:30:56 EDT Ventricular Rate:  98 PR Interval:  127 QRS Duration: 99 QT Interval:  347 QTC Calculation: 443 R Axis:   -30 Text Interpretation: Sinus rhythm Left axis deviation Confirmed  by Tilden Fossa [phone]) on 07/29/2020 6:32:56 PM  Radiology DG Chest 2 View  Result Date: 07/29/2020 CLINICAL DATA:  Five days of left chest pain EXAM: CHEST - 2 VIEW COMPARISON:  Radiograph 09/20/2011, CT 07/01/2019 FINDINGS: No consolidation, features of edema, pneumothorax, or effusion. Pulmonary vascularity is normally distributed. The cardiomediastinal contours are unremarkable. No acute osseous or soft tissue abnormality. Telemetry leads overlie the chest. IMPRESSION: No acute cardiopulmonary abnormality. Electronically Signed   By: Kreg Shropshire M.D.   On: 07/29/2020 19:38    Procedures Procedures   Medications Ordered in ED Medications  ketorolac (TORADOL) 15 MG/ML injection 15 mg (15 mg Intravenous Given 07/29/20 2110)    ED Course  I have reviewed the triage vital signs and the nursing notes.  Pertinent labs & imaging  results that were available during my care of the patient were reviewed by me and considered in my medical decision making (see chart for details).    MDM Rules/Calculators/A&P                         patient with five days of chest pain. Reproducible on examination. EKG is without acute ischemic changes. Troponin is negative. Pain is better after Toradol and emergency department. Presentation is not consistent with ACS, PE, dissection. Discussed with patient home care for costochondritis. Final Clinical Impression(s) / ED Diagnoses Final diagnoses:  Acute costochondritis    Rx / DC Orders ED Discharge Orders     None        Tilden Fossa, MD 07/30/20 0009

## 2020-07-29 NOTE — ED Triage Notes (Addendum)
C/o left sided chest pain x 5 days, denies SOb , motrin 1115 this am with relief, pt reports increased stress

## 2020-07-30 ENCOUNTER — Ambulatory Visit: Payer: BC Managed Care – PPO | Admitting: Physician Assistant

## 2020-07-30 ENCOUNTER — Encounter: Payer: Self-pay | Admitting: Physician Assistant

## 2020-07-30 ENCOUNTER — Other Ambulatory Visit: Payer: Self-pay

## 2020-07-30 VITALS — BP 139/92 | HR 88 | Temp 97.5°F | Ht 68.0 in | Wt 163.0 lb

## 2020-07-30 DIAGNOSIS — M940 Chondrocostal junction syndrome [Tietze]: Secondary | ICD-10-CM | POA: Diagnosis not present

## 2020-07-30 DIAGNOSIS — F410 Panic disorder [episodic paroxysmal anxiety] without agoraphobia: Secondary | ICD-10-CM | POA: Diagnosis not present

## 2020-07-30 MED ORDER — CLONAZEPAM 0.5 MG PO TABS
ORAL_TABLET | ORAL | 0 refills | Status: DC
Start: 2020-07-30 — End: 2020-12-07

## 2020-07-30 NOTE — Patient Instructions (Signed)
Good to meet you today!  I think you are experiencing panic attacks.  Please take the Clonazepam as directed and send me a MyChart message this week to let me know how your sleep and anxiety are doing. Do not hesitate to go to the ED if any severe chest pain, shortness of breath, worst headache of life, etc.

## 2020-07-30 NOTE — Progress Notes (Signed)
New Patient Office Visit  Subjective:  Patient ID: Edward Carpenter, male    DOB: 1971/01/06  Age: 50 y.o. MRN: [id number]  CC:  Chief Complaint  Patient presents with   Chest Pain    HPI Edward Carpenter presents for new patient establishment and f/up on chest pain. He started having chest pain about 5 days ago and finally went to the ED yesterday (07/29/20). He was diagnosed with acute costochondritis. He was given Toradol IM and his pain did improve. He also feels like he is having "panic attacks", where it feels like the walls are closing in, he starts sweating, and feels very anxious.  Pt says his wife thinks he has anxiety and over-thinks a lot. She apparently encouraged him to seek out having a primary care provider. He is a very light sleeper and has been that way for years. He thinks it is because of ongoing anxiety as well.   No significant medical history. Takes no medications.  Dr. Levora Angel is his GI - hx of Diverticulitis.   He denies any SOB or CP today. No SI.   Past Medical History:  Diagnosis Date   Allergy    Diverticulitis    GERD (gastroesophageal reflux disease)     History reviewed. No pertinent surgical history.  Family History  Problem Relation Age of Onset   Valvular heart disease Mother     Social History   Socioeconomic History   Marital status: Married    Spouse name: Not on file   Number of children: Not on file   Years of education: Not on file   Highest education level: Not on file  Occupational History   Not on file  Tobacco Use   Smoking status: Never   Smokeless tobacco: Never  Substance and Sexual Activity   Alcohol use: No   Drug use: No   Sexual activity: Not on file  Other Topics Concern   Not on file  Social History Narrative   Media planner at [employer]    Social Determinants of Health   Financial Resource Strain: Not on file  Food Insecurity: Not on file  Transportation Needs: Not on file  Physical Activity:  Not on file  Stress: Not on file  Social Connections: Not on file  Intimate Partner Violence: Not on file    ROS Review of Systems REFER TO HPI FOR PERTINENT POSITIVES AND NEGATIVES   Objective:   Today's Vitals: BP (!) 139/92   Pulse 88   Temp (!) 97.5 F (36.4 C)   Ht 5\' 8"  (1.727 m)   Wt 163 lb (73.9 kg)   SpO2 96%   BMI 24.78 kg/m   Physical Exam Vitals and nursing note reviewed.  Constitutional:      General: He is not in acute distress.    Appearance: Normal appearance. He is not toxic-appearing.  HENT:     Head: Normocephalic and atraumatic.     Right Ear: Tympanic membrane, ear canal and external ear normal.     Left Ear: Tympanic membrane, ear canal and external ear normal.     Nose: Nose normal.     Mouth/Throat:     Mouth: Mucous membranes are moist.     Pharynx: Oropharynx is clear.  Eyes:     Extraocular Movements: Extraocular movements intact.     Conjunctiva/sclera: Conjunctivae normal.     Pupils: Pupils are equal, round, and reactive to light.  Cardiovascular:     Rate and Rhythm: Normal rate and  regular rhythm.     Pulses: Normal pulses.     Heart sounds: Normal heart sounds.  Pulmonary:     Effort: Pulmonary effort is normal.     Breath sounds: Normal breath sounds.  Abdominal:     General: Abdomen is flat.     Tenderness: There is no abdominal tenderness.  Musculoskeletal:        General: Normal range of motion.     Cervical back: Normal range of motion and neck supple.  Skin:    General: Skin is warm and dry.  Neurological:     General: No focal deficit present.     Mental Status: He is alert and oriented to person, place, and time.  Psychiatric:        Mood and Affect: Mood is anxious.        Behavior: Behavior normal.    Assessment & Plan:   Problem List Items Addressed This Visit   None Visit Diagnoses     Panic disorder (episodic paroxysmal anxiety)    -  Primary   Acute costochondritis           Outpatient Encounter  Medications as of 07/30/2020  Medication Sig   clonazePAM (KLONOPIN) 0.5 MG tablet Take 1/2 to 1 tab PO daily at bedtime for sleep and anxiety.   Ibuprofen (ADVIL) 200 MG CAPS Take by mouth.   Multiple Vitamin (MULTIVITAMIN) capsule Take 1 capsule by mouth daily.   No facility-administered encounter medications on file as of 07/30/2020.    Follow-up: Return in about 2 weeks (around 08/13/2020) for med check / virtual is OK .   1. Panic disorder (episodic paroxysmal anxiety) Patient is very anxious on exam. He does experience a panic attack while in exam room with me, that lasts about 30 seconds where he felt overly anxious & started sweating. We discussed several options including SSRIs vs Buspar vs Benzodiazapenes, and at this time I do feel like he would benefit most from Clonazepam. Will start with once at bedtime to see if this improves his sleep, may need to increase to twice daily. Consider counseling. Will have very close f/up with me. PDMP reviewed and no red flags.  2. Acute costochondritis I personally reviewed ED note. Encouraged him to take the ibuprofen as directed.    Charolette Bultman M Shaneal Barasch, PA-C

## 2020-08-28 ENCOUNTER — Encounter: Payer: Self-pay | Admitting: Physician Assistant

## 2020-08-30 ENCOUNTER — Other Ambulatory Visit: Payer: Self-pay | Admitting: Physician Assistant

## 2020-12-07 ENCOUNTER — Telehealth (INDEPENDENT_AMBULATORY_CARE_PROVIDER_SITE_OTHER): Payer: BC Managed Care – PPO | Admitting: Physician Assistant

## 2020-12-07 DIAGNOSIS — K5792 Diverticulitis of intestine, part unspecified, without perforation or abscess without bleeding: Secondary | ICD-10-CM | POA: Diagnosis not present

## 2020-12-07 DIAGNOSIS — F410 Panic disorder [episodic paroxysmal anxiety] without agoraphobia: Secondary | ICD-10-CM

## 2020-12-07 MED ORDER — AMOXICILLIN-POT CLAVULANATE 875-125 MG PO TABS
1.0000 | ORAL_TABLET | Freq: Two times a day (BID) | ORAL | 0 refills | Status: DC
Start: 2020-12-07 — End: 2021-04-21

## 2020-12-07 MED ORDER — CLONAZEPAM 0.5 MG PO TABS: ORAL_TABLET | ORAL | 0 refills | Status: DC

## 2020-12-07 NOTE — Progress Notes (Signed)
Virtual Visit via Video Note  I connected with  Edward Carpenter  on 12/07/20 at  9:00 AM EST by a video enabled telemedicine application and verified that I am speaking with the correct person using two identifiers.  Location: Patient: home Provider: Nature conservation officer at [employer] Persons present: Patient and myself   I discussed the limitations of evaluation and management by telemedicine and the availability of in person appointments. The patient expressed understanding and agreed to proceed.   History of Present Illness:  Abdominal pain -Patient states that he has a history of diverticulitis.  His last flareup was about 1 and half years ago.  For the last week now he has had constant left lower quadrant abdominal pain, about 6 out of 10, worse just before bowel movement.  He denies any nausea, vomiting, diarrhea, fever.  He has not had any blood in the stool.  He states that he used to be treated with metronidazole and Cipro, but the metronidazole has too much of the bitter taste and it makes him gag. He sees Dr. Levora Angel, GI, and he is up-to-date on his colonoscopies.  Panic, anxiety -Patient last saw me on 07/30/2020 and at that time we started on clonazepam 0.5 1/2-1 tab to take as needed anxiety and panic.  He states that he has 4 pills left and he is requesting a refill.  He states that these have been a tremendous help for him because the panic just comes on out of nowhere.  He said the last time it happened he was just watching TV with his wife and all of a sudden he felt extreme sadness and fear at the same time.  He will take 1 clonazepam when his feelings come on and it resolves within about 30 minutes.  He does not want to take any medication on a daily basis and is happy to stay with this regimen as needed for now.   Observations/Objective:   Gen: Awake, alert, no acute distress Resp: Breathing is even and non-labored Psych: calm/pleasant demeanor Neuro: Alert and  Oriented x 3, + facial symmetry, speech is clear.   Assessment and Plan:  1. Acute diverticulitis History of diverticulitis.  No red flag symptoms.  Agreed to treat with Augmentin at this time.  He knows to push fluids and stick to a BRAT, bland diet.  Should he develop any acutely worsening symptoms, he knows to go to the emergency department.  If his symptoms persist or are not improving, he should f/up with myself or his GI physician in person.   2. Panic disorder (episodic paroxysmal anxiety) PDMP reviewed today, no red flags, filling appropriately. Refilled Klonopin 0.5 mg #45 tablets today. He is doing well with this regimen. He will f/up with me in 3-6 months. He knows to call sooner if any worsening or changes in symptoms.    Follow Up Instructions:    I discussed the assessment and treatment plan with the patient. The patient was provided an opportunity to ask questions and all were answered. The patient agreed with the plan and demonstrated an understanding of the instructions.   The patient was advised to call back or seek an in-person evaluation if the symptoms worsen or if the condition fails to improve as anticipated.  Yaritzi Craun M Nassim Cosma, PA-C

## 2021-01-22 DIAGNOSIS — Z20822 Contact with and (suspected) exposure to covid-19: Secondary | ICD-10-CM | POA: Diagnosis not present

## 2021-01-29 DIAGNOSIS — Z20822 Contact with and (suspected) exposure to covid-19: Secondary | ICD-10-CM | POA: Diagnosis not present

## 2021-04-16 ENCOUNTER — Other Ambulatory Visit: Payer: Self-pay | Admitting: Physician Assistant

## 2021-04-18 ENCOUNTER — Other Ambulatory Visit: Payer: Self-pay | Admitting: Physician Assistant

## 2021-04-19 MED ORDER — CLONAZEPAM 0.5 MG PO TABS: ORAL_TABLET | ORAL | 0 refills | Status: DC

## 2021-04-19 NOTE — Telephone Encounter (Signed)
Last visit: 12/30/20 ? ?Next visit: n/a ? ?Last filled: 12/07/20 ? ?Quantity: 20 w/ 0 refills  ?

## 2021-04-19 NOTE — Telephone Encounter (Signed)
Last visit: 12/07/20 ? ?Next visit: n/a ? ?Last filled: 12/07/20 ? ?Quantity: 45 w/ 0 refills  ?

## 2021-04-21 ENCOUNTER — Ambulatory Visit: Payer: BC Managed Care – PPO | Admitting: Physician Assistant

## 2021-04-21 ENCOUNTER — Encounter: Payer: Self-pay | Admitting: Physician Assistant

## 2021-04-21 VITALS — BP 129/86 | HR 97 | Temp 98.3°F | Ht 68.0 in | Wt 163.0 lb

## 2021-04-21 DIAGNOSIS — F410 Panic disorder [episodic paroxysmal anxiety] without agoraphobia: Secondary | ICD-10-CM | POA: Diagnosis not present

## 2021-04-21 DIAGNOSIS — K5792 Diverticulitis of intestine, part unspecified, without perforation or abscess without bleeding: Secondary | ICD-10-CM

## 2021-04-21 MED ORDER — CLONAZEPAM 0.5 MG PO TABS: ORAL_TABLET | ORAL | 0 refills | Status: DC

## 2021-04-21 MED ORDER — AMOXICILLIN-POT CLAVULANATE 875-125 MG PO TABS: 1.0000 | ORAL_TABLET | Freq: Two times a day (BID) | ORAL | 0 refills | Status: DC

## 2021-04-21 NOTE — Progress Notes (Signed)
? ?Subjective:  ? ? Patient ID: Edward Carpenter, male    DOB: [birth year], 51 y.o.   MRN: [id number] ? ?Chief Complaint  ?Patient presents with  ? Irritable Bowel Syndrome  ?  Abdominal pain  ? ? ?HPI ?Patient is in today for probable flare of diverticulitis which started on Friday, 04/16/21. ? ?Patient states to a restaurant with his wife for their anniversary and started having symptoms later that evening.  He complains of abdominal pain in the left lower quadrant and periumbilical. Currently taking dicyclomine, which is helping with cramps. Says he's still having abdominal pain. Black pepper seemed to trigger this current flare. Goes on a liquid diet the day after pain starts, which does help it to calm down. No fever or chills. No blood in the stool. No diarrhea.  No dizziness no headaches. ? ?Last flare of diverticulitis was 12/07/2020 and he was treated by myself with Augmentin and this resolved quickly for him. ? ?"Food by repetition" - says if he eats grilled chicken, steamed broccoli, boiled potatoes; regular foods, keeps flare-ups down and gut healthy.  ? ?Last colonoscopy was about 2-3 years ago.  Plans to see his GI physician in May this year.  Patient states that he is very nervous about that visit and worried about any talk or discussion of surgery.  Patient states that he does not want to have any extensive procedures done and is interested in other options to try to keep flareups down.  States that he has traveled a lot during his lifetime and only started having issues once he moved to [place]. ? ?Patient states that he continues to have some issues with panic and anxiety.  He has been taking clonazepam as directed and this helps tremendously.  He does not want to go on a daily medication at this time still.  Needing a refill today. ? ?Past Medical History:  ?Diagnosis Date  ? Allergy   ? Diverticulitis   ? GERD (gastroesophageal reflux disease)   ? ? ?History reviewed. No pertinent surgical  history. ? ?Family History  ?Problem Relation Age of Onset  ? Valvular heart disease Mother   ? ? ?Social History  ? ?Tobacco Use  ? Smoking status: Never  ? Smokeless tobacco: Never  ?Substance Use Topics  ? Alcohol use: No  ? Drug use: No  ?  ? ?Allergies  ?Allergen Reactions  ? Prednisone Other (See Comments)  ? ? ?Review of Systems ?NEGATIVE UNLESS OTHERWISE INDICATED IN HPI ? ? ?   ?Objective:  ?  ? ?BP 129/86   Pulse 97   Temp 98.3 ?F (36.8 ?C)   Ht 5\' 8"  (1.727 m)   Wt 163 lb (73.9 kg)   SpO2 98%   BMI 24.78 kg/m?  ? ?Wt Readings from Last 3 Encounters:  ?04/21/21 163 lb (73.9 kg)  ?07/30/20 163 lb (73.9 kg)  ?07/29/20 160 lb (72.6 kg)  ? ? ?BP Readings from Last 3 Encounters:  ?04/21/21 129/86  ?07/30/20 (!) 139/92  ?07/29/20 119/82  ?  ? ?Physical Exam ?Vitals and nursing note reviewed.  ?Constitutional:   ?   General: He is not in acute distress. ?   Appearance: Normal appearance. He is not toxic-appearing.  ?HENT:  ?   Head: Normocephalic and atraumatic.  ?   Right Ear: External ear normal.  ?   Left Ear: External ear normal.  ?   Mouth/Throat:  ?   Mouth: Mucous membranes are moist.  ?Eyes:  ?  Extraocular Movements: Extraocular movements intact.  ?   Conjunctiva/sclera: Conjunctivae normal.  ?   Pupils: Pupils are equal, round, and reactive to light.  ?Cardiovascular:  ?   Rate and Rhythm: Normal rate and regular rhythm.  ?   Pulses: Normal pulses.  ?   Heart sounds: Normal heart sounds.  ?Pulmonary:  ?   Effort: Pulmonary effort is normal.  ?   Breath sounds: Normal breath sounds.  ?Abdominal:  ?   General: Abdomen is flat. Bowel sounds are normal.  ?   Palpations: Abdomen is soft. There is no mass.  ?   Tenderness: There is abdominal tenderness (LLQ and periumbilical). There is guarding. There is no right CVA tenderness or left CVA tenderness.  ?Musculoskeletal:     ?   General: Normal range of motion.  ?   Cervical back: Normal range of motion and neck supple.  ?Skin: ?   General: Skin is warm  and dry.  ?Neurological:  ?   General: No focal deficit present.  ?   Mental Status: He is alert and oriented to person, place, and time.  ?Psychiatric:     ?   Mood and Affect: Mood normal.     ?   Behavior: Behavior normal.  ? ? ?   ?Assessment & Plan:  ? ?Problem List Items Addressed This Visit   ?None ?Visit Diagnoses   ? ? Acute diverticulitis    -  Primary  ? Relevant Medications  ? amoxicillin-clavulanate (AUGMENTIN) 875-125 MG tablet  ? Panic disorder (episodic paroxysmal anxiety)      ? ?  ? ? ? ?Meds ordered this encounter  ?Medications  ? amoxicillin-clavulanate (AUGMENTIN) 875-125 MG tablet  ?  Sig: Take 1 tablet by mouth 2 (two) times daily.  ?  Dispense:  20 tablet  ?  Refill:  0  ? clonazePAM (KLONOPIN) 0.5 MG tablet  ?  Sig: Take 1/2 to 1 tab PO daily at bedtime for sleep and anxiety.  ?  Dispense:  45 tablet  ?  Refill:  0  ? ?1. Acute diverticulitis ?Symptoms are very consistent with previous episodes of diverticulitis flare.  He is very tender especially in the left lower quadrant.  He has done well with Augmentin in the past and started him on this today.  Low threshold for ER if he develops any worsening symptoms such as severe unrelenting pain, nausea or vomiting, blood in stool, or fevers.  Patient agreeable and understanding.  He will let me know how he is doing by Friday this week. ? ?2. Panic disorder (episodic paroxysmal anxiety) ?PDMP reviewed today, no red flags, filling appropriately.  ?Doing well with clonazepam and refilled this accordingly. ?Still need to consider daily medications to help reduce episodes, patient still not sure at this time.  Plan to address again this summer. ? ? ? ?This note was prepared with assistance of Systems analyst. Occasional wrong-word or sound-a-like substitutions may have occurred due to the inherent limitations of voice recognition software. ? ? ? ?Bobbyjo Marulanda M Dontay Harm, PA-C ?

## 2021-04-23 ENCOUNTER — Encounter: Payer: Self-pay | Admitting: Physician Assistant

## 2021-05-31 DIAGNOSIS — K589 Irritable bowel syndrome without diarrhea: Secondary | ICD-10-CM | POA: Diagnosis not present

## 2021-05-31 DIAGNOSIS — Z8719 Personal history of other diseases of the digestive system: Secondary | ICD-10-CM | POA: Diagnosis not present

## 2021-06-07 DIAGNOSIS — S93691A Other sprain of right foot, initial encounter: Secondary | ICD-10-CM | POA: Diagnosis not present

## 2021-06-08 ENCOUNTER — Other Ambulatory Visit: Payer: Self-pay

## 2021-06-08 ENCOUNTER — Emergency Department (HOSPITAL_BASED_OUTPATIENT_CLINIC_OR_DEPARTMENT_OTHER)
Admission: EM | Admit: 2021-06-08 | Discharge: 2021-06-08 | Disposition: A | Discharge status: Home / Self Care | Payer: BC Managed Care – PPO | Attending: Emergency Medicine | Admitting: Emergency Medicine

## 2021-06-08 ENCOUNTER — Emergency Department (HOSPITAL_BASED_OUTPATIENT_CLINIC_OR_DEPARTMENT_OTHER): Payer: BC Managed Care – PPO

## 2021-06-08 ENCOUNTER — Encounter (HOSPITAL_BASED_OUTPATIENT_CLINIC_OR_DEPARTMENT_OTHER): Payer: Self-pay

## 2021-06-08 DIAGNOSIS — M79671 Pain in right foot: Secondary | ICD-10-CM | POA: Diagnosis not present

## 2021-06-08 DIAGNOSIS — S99921A Unspecified injury of right foot, initial encounter: Secondary | ICD-10-CM | POA: Diagnosis not present

## 2021-06-08 DIAGNOSIS — W228XXA Striking against or struck by other objects, initial encounter: Secondary | ICD-10-CM | POA: Insufficient documentation

## 2021-06-08 DIAGNOSIS — S93601A Unspecified sprain of right foot, initial encounter: Secondary | ICD-10-CM | POA: Diagnosis not present

## 2021-06-08 MED ORDER — NAPROXEN 375 MG PO TABS: ORAL_TABLET | ORAL | 0 refills | Status: DC

## 2021-06-08 MED ORDER — NAPROXEN 250 MG PO TABS
500.0000 mg | ORAL_TABLET | Freq: Once | ORAL | Status: AC
  Administered 2021-06-08: 500 mg via ORAL
  Filled 2021-06-08: qty 2

## 2021-06-08 NOTE — ED Triage Notes (Signed)
Kicked a metal door around 6:30 with his rt foot. Hurts on top of foot through to the bottom. ?

## 2021-06-08 NOTE — ED Provider Notes (Signed)
? ?[facility] ?Provider Note: Lowella Dell, MD, FACEP ? ?CSN: [id number] ?MRN: [id number] ?ARRIVAL: 06/08/21 at 0052 ?ROOM: [facility] ? ? ?CHIEF COMPLAINT  ?Foot Injury ? ? ?HISTORY OF PRESENT ILLNESS  ?06/08/21 2:42 AM ?Edward Carpenter is a 51 y.o. male who kicked a metal door about 6:30 PM yesterday with his right foot.  He is having pain throughout in his right foot most prominently at the base of the fifth metatarsal.  He rates the pain as a 9 out of 10, aching in nature.  It is constant but worse with weightbearing. ? ? ?Past Medical History:  ?Diagnosis Date  ? Allergy   ? Diverticulitis   ? GERD (gastroesophageal reflux disease)   ? ? ?History reviewed. No pertinent surgical history. ? ?Family History  ?Problem Relation Age of Onset  ? Valvular heart disease Mother   ? ? ?Social History  ? ?Tobacco Use  ? Smoking status: Never  ? Smokeless tobacco: Never  ?Substance Use Topics  ? Alcohol use: No  ? Drug use: No  ? ? ?Prior to Admission medications   ?Medication Sig Start Date End Date Taking? Authorizing Provider  ?naproxen (NAPROSYN) 375 MG tablet Take 1 tablet twice daily as needed for pain. 06/08/21  Yes Taheerah Guldin, MD  ?clonazePAM (KLONOPIN) 0.5 MG tablet Take 1/2 to 1 tab PO daily at bedtime for sleep and anxiety. 04/21/21   Allwardt, Crist Infante, PA-C  ?Multiple Vitamin (MULTIVITAMIN) capsule Take 1 capsule by mouth daily.    [provider]  ? ? ?Allergies ?Prednisone ? ? ?REVIEW OF SYSTEMS  ?Negative except as noted here or in the History of Present Illness. ? ? ?PHYSICAL EXAMINATION  ?Initial Vital Signs ?Blood pressure 125/71, pulse 99, temperature 98.4 ?F (36.9 ?C), temperature source Oral, resp. rate 18, height 5\' 8"  (1.727 m), weight 73 kg, SpO2 98 %. ? ?Examination ?General: Well-developed, well-nourished male in no acute distress; appearance consistent with age of record ?HENT: normocephalic; atraumatic ?Eyes: Normal appearance ?Neck: supple ?Heart: regular rate and rhythm ?Lungs:  clear to auscultation bilaterally ?Abdomen: soft; nondistended; nontender; bowel sounds present ?Extremities: No deformity; tenderness and ecchymosis over the base of the right fifth metatarsal, foot distally neurovascularly intact ?Neurologic: Awake, alert and oriented; motor function intact in all extremities and symmetric; no facial droop ?Skin: Warm and dry ?Psychiatric: Normal mood and affect ? ? ?RESULTS  ?Summary of this visit's results, reviewed and interpreted by myself: ? ? EKG Interpretation ? ?Date/Time:    ?Ventricular Rate:    ?PR Interval:    ?QRS Duration:   ?QT Interval:    ?QTC Calculation:   ?R Axis:     ?Text Interpretation:   ?  ? ?  ? ?Laboratory Studies: ?No results found for this or any previous visit (from the past 24 hour(s)). ?Imaging Studies: ?DG Foot Complete Right ? ?Result Date: 06/08/2021 ?CLINICAL DATA:  Kicked door.  Foot pain EXAM: RIGHT FOOT COMPLETE - 3+ VIEW COMPARISON:  None Available. FINDINGS: There is no evidence of fracture or dislocation. There is no evidence of arthropathy or other focal bone abnormality. Soft tissues are unremarkable. IMPRESSION: Negative. Electronically Signed   By: 08/08/2021 M.D.   On: 06/08/2021 01:26   ? ?ED COURSE and MDM  ?Nursing notes, initial and subsequent vitals signs, including pulse oximetry, reviewed and interpreted by myself. ? ?Vitals:  ? 06/08/21 0100 06/08/21 0102  ?BP: 125/71   ?Pulse: 99   ?Resp: 18   ?Temp: 98.4 ?F (  36.9 ?C)   ?TempSrc: Oral   ?SpO2: 98%   ?Weight:  73 kg  ?Height:  5\' 8"  (1.727 m)  ? ?Medications  ?naproxen (NAPROSYN) tablet 500 mg (has no administration in time range)  ? ? ?We will place the patient in a postop shoe and provide crutches.  We will refer to sports medicine if symptoms persist. ? ?PROCEDURES  ?Procedures ? ? ?ED DIAGNOSES  ? ?  ICD-10-CM   ?1. Sprain of right foot, initial encounter  S93.601A   ?  ? ? ? ?  ? , MD ?06/08/21 0249 ? ?

## 2021-08-18 ENCOUNTER — Other Ambulatory Visit: Payer: Self-pay | Admitting: Physician Assistant

## 2021-08-19 NOTE — Telephone Encounter (Signed)
Last OV: 04/21/21  Next OV: Not scheduled  Last filled: 04/21/21  Quantity: 45

## 2021-10-02 IMAGING — DX DG CHEST 2V
2 series · 2 of 2 positions shown · non-contrast
Comparison: Radiograph 09/20/2011, CT 07/01/2019

CLINICAL DATA: Five days of left chest pain

EXAM:
CHEST - 2 VIEW

[chest pa]
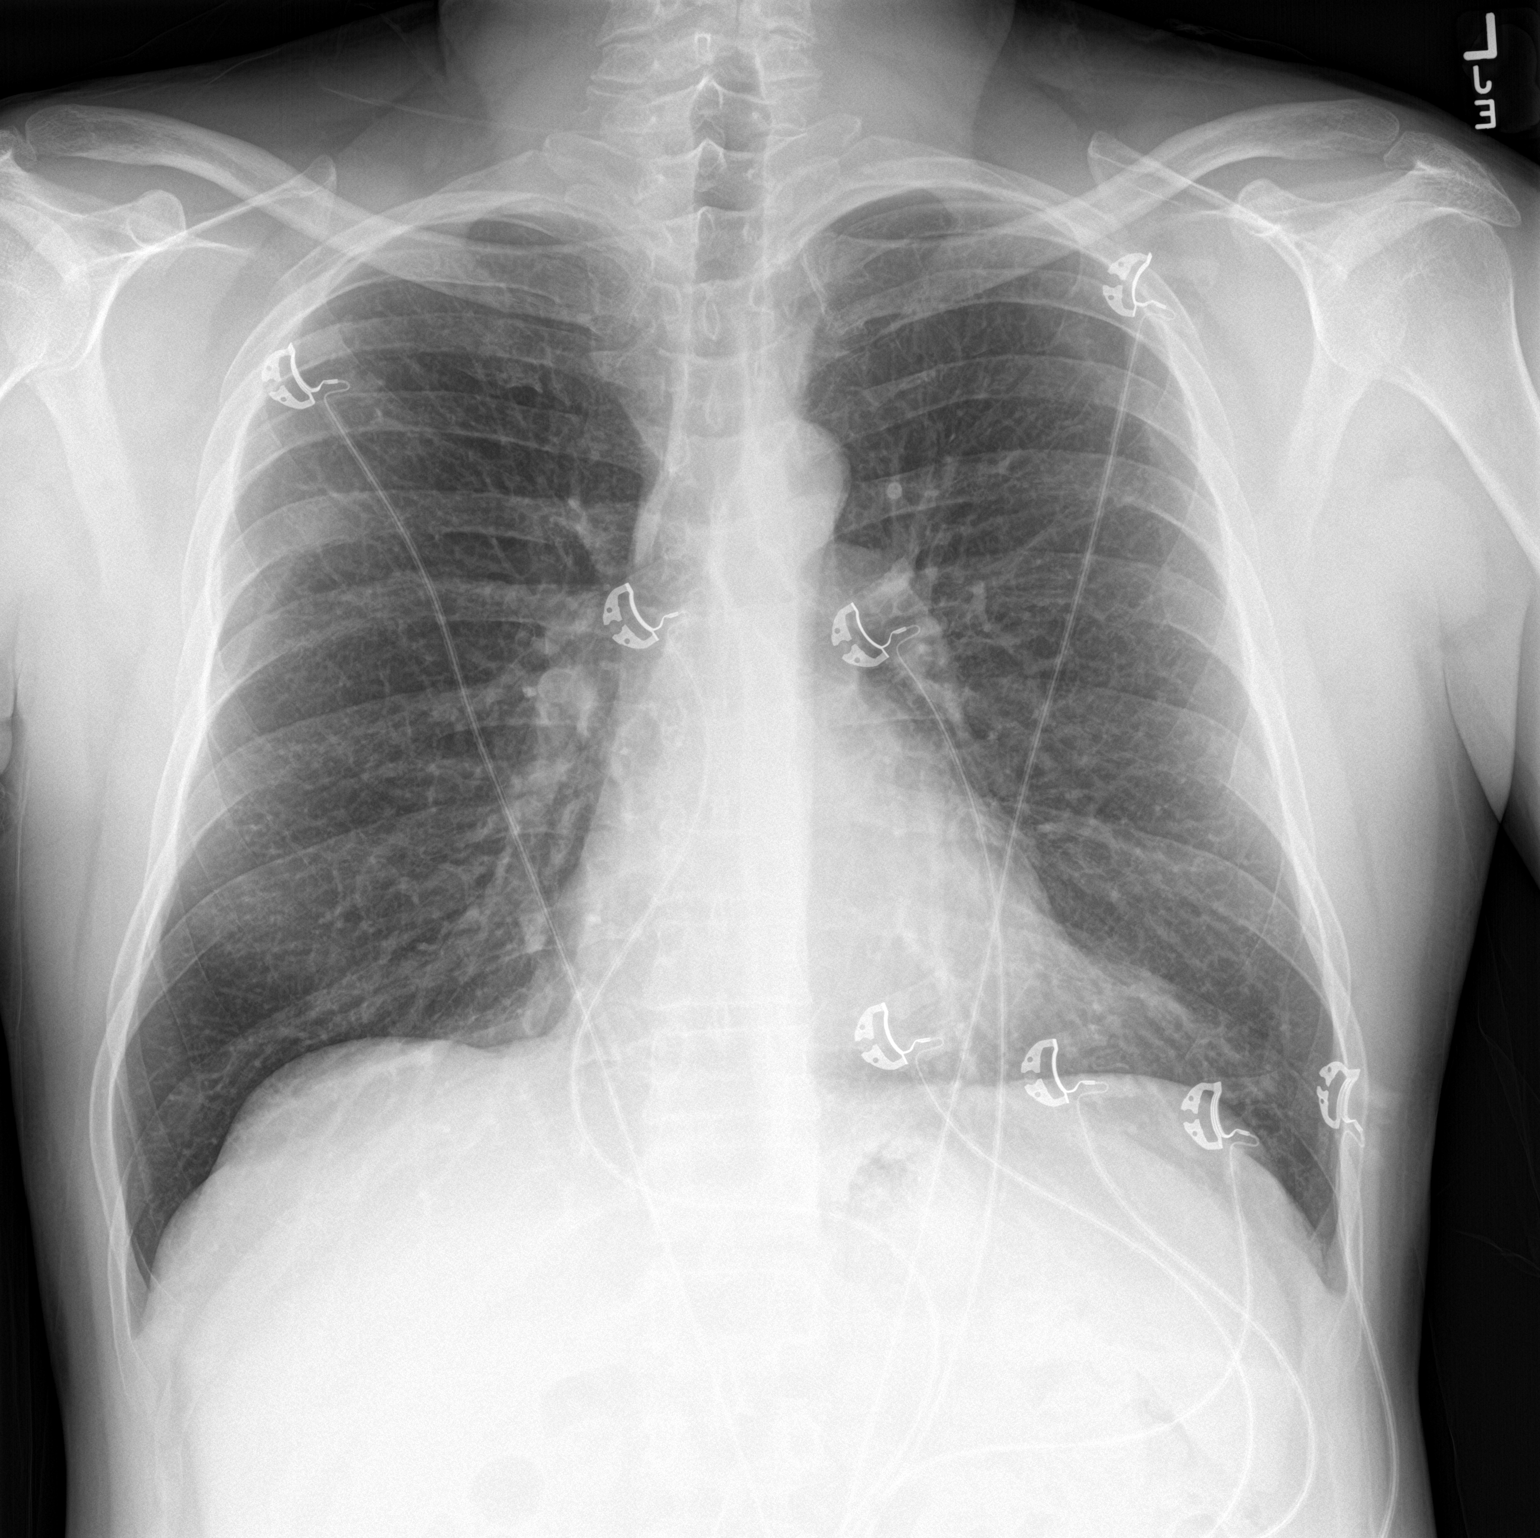

[chest lat]
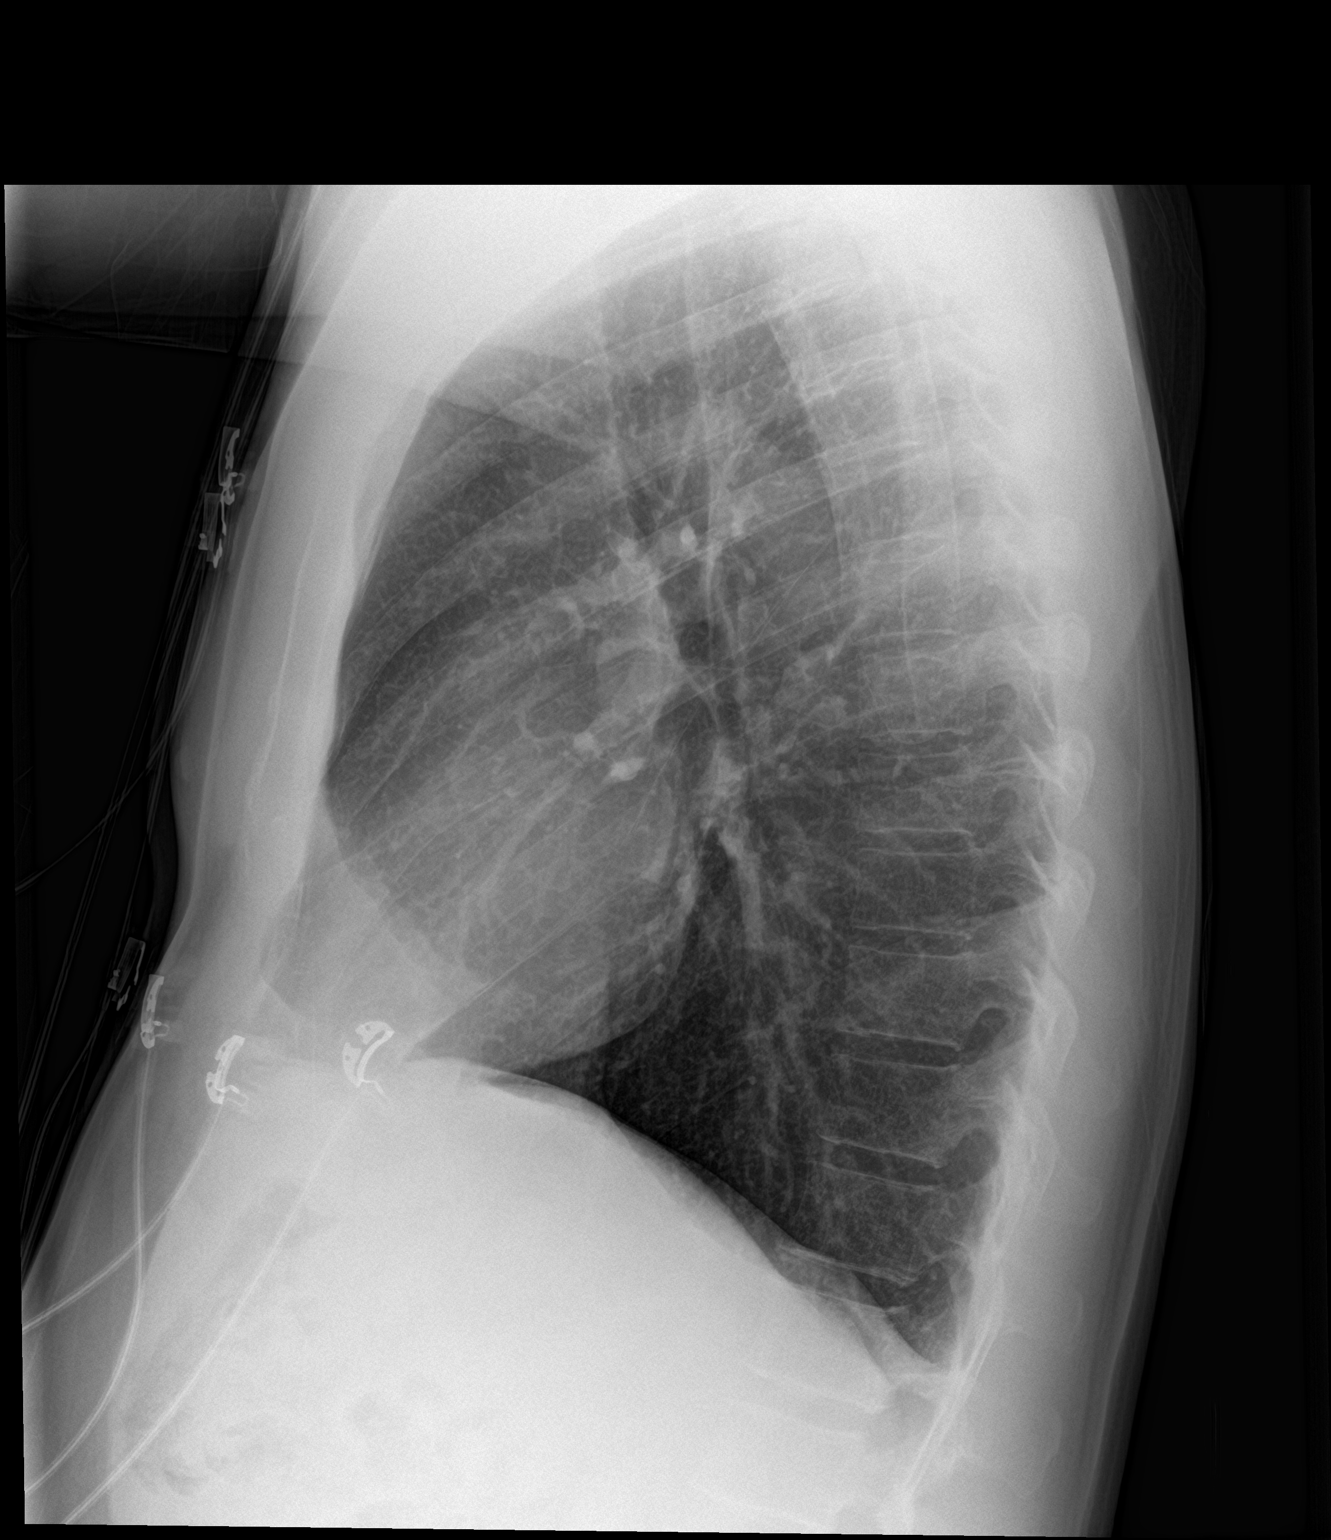

[2 of 2 positions shown; findings below may reference images not displayed]

FINDINGS: No consolidation, features of edema, pneumothorax, or effusion.
Pulmonary vascularity is normally distributed. The cardiomediastinal
contours are unremarkable. No acute osseous or soft tissue
abnormality. Telemetry leads overlie the chest.
IMPRESSION: No acute cardiopulmonary abnormality.

## 2021-10-25 ENCOUNTER — Encounter: Payer: Self-pay | Admitting: *Deleted

## 2021-12-28 ENCOUNTER — Telehealth: Payer: Self-pay | Admitting: Physician Assistant

## 2021-12-28 NOTE — Telephone Encounter (Signed)
Please see triage note for patient 

## 2021-12-28 NOTE — Telephone Encounter (Signed)
Patient states he has been experiencing sharp dull pain under rib cage on right side for approx. 1 week-after eating last night he had a burning feeling from rib cage to stomach/stabbing feeling that went away  Transferred to Triage Lelon Mast)

## 2021-12-28 NOTE — Telephone Encounter (Signed)
Awaiting triage note.  

## 2021-12-28 NOTE — Telephone Encounter (Signed)
Pt was advised to go to ED  Patient Name: Edward Carpenter Gender: Male DOB: [birth year] Age: 51 Y Return Phone Number: [phone] (Primary) Address: City/ State/ Zip: [place]  [address] Client [facility] - Administrator, [facility] at [facility] Insurance claims handler, Media planner- PA Contact Type Call Who Is Calling Patient / Member / Family / Caregiver Call Type Triage / Clinical Relationship To Patient Self Return Phone Number [phone] (Primary) Chief Complaint CHEST PAIN - pain, pressure, heaviness or tightness Reason for Call Symptomatic / Request for Health Information Initial Comment Caller states Edward Carpenter from the office with a pt on the line that states he has sharp, dull pain under rib cage on right side for approx a week. He has a burning from rib cage to stomach and has stabbing pain after eating that went away. It only hurts during eating, got worse after jogging Translation No Nurse Assessment Nurse: Edward Silversmith, RN, Edward Carpenter Date/Time Edward Carpenter Time): 12/28/2021 8:28:32 AM Confirm and document reason for call. If symptomatic, describe symptoms. ---Caller states low, dull pain under right rib cage. Pain 4 on 0/10 pain scale. Hurts more when laying down. Hurts more with eating. Has heartburn but this pain feels like quick stab and hot moving up from stomach. Hurts more before food, then eats it hurts and then resolves. Caller states he is a healthy eater and physically active. After exercise on treadmill this am, the pain was sharp but then pain goes down but has persisted for weeks. Had same pain 2 years ago and checked gallbladder, said there was sludge but it eventually resolved with no intervention. Denies fever. Sometimes does have pain that radiates to the shoulder on the right side and radiates to the scrotum also. Does the patient have any new or worsening symptoms? ---Yes Will a triage be completed?  ---Yes Related visit to physician within the last 2 weeks? ---No Does the PT have any chronic conditions? (i.e. diabetes, asthma, this includes High risk factors for pregnancy, etc.) ---Yes List chronic conditions. ---Anxiety GERD Nurse Assessment Is this a behavioral health or substance abuse call? ---No Guidelines Guideline Title Affirmed Question Affirmed Notes Nurse Date/Time (Eastern Time) Chest Pain Pain also in shoulder(s) or arm(s) or jaw (Exception: Pain is clearly made worse by movement.) Edward Silversmith, RN, Edward Carpenter 12/28/2021 8:35:12 AM Disp. Time Edward Carpenter Time) Disposition Final User 12/28/2021 8:26:00 AM Send to Urgent Queue Edward Carpenter 12/28/2021 8:40:35 AM Go to ED Now Yes Edward Silversmith, RN, Edward Carpenter Final Disposition 12/28/2021 8:40:35 AM Go to ED Now Yes Edward Silversmith, RN, Edward Carpenter Disagree/Comply Comply Caller Understands Yes PreDisposition Did not know what to do Care Advice Given Per Guideline GO TO ED NOW: * You need to be seen in the Emergency Department. * Leave now. Drive carefully. NOTE TO TRIAGER - DRIVING: * Another adult should drive. * Patient should not delay going to the emergency department. BRING MEDICINES: * Severe difficulty breathing occurs * Passes out or becomes too weak to stand * You become worse CALL EMS IF: CARE ADVICE given per Chest Pain (Adult) guideline. Referrals [facility] - ED

## 2021-12-28 NOTE — Telephone Encounter (Signed)
Noted and agreed, thank you. 

## 2021-12-29 ENCOUNTER — Emergency Department (HOSPITAL_BASED_OUTPATIENT_CLINIC_OR_DEPARTMENT_OTHER): Payer: BC Managed Care – PPO

## 2021-12-29 ENCOUNTER — Other Ambulatory Visit: Payer: Self-pay

## 2021-12-29 ENCOUNTER — Emergency Department (HOSPITAL_BASED_OUTPATIENT_CLINIC_OR_DEPARTMENT_OTHER)
Admission: EM | Admit: 2021-12-29 | Discharge: 2021-12-29 | Disposition: A | Discharge status: Home / Self Care | Payer: BC Managed Care – PPO | Attending: Emergency Medicine | Admitting: Emergency Medicine

## 2021-12-29 DIAGNOSIS — R1011 Right upper quadrant pain: Secondary | ICD-10-CM | POA: Insufficient documentation

## 2021-12-29 DIAGNOSIS — R1013 Epigastric pain: Secondary | ICD-10-CM

## 2021-12-29 DIAGNOSIS — R079 Chest pain, unspecified: Secondary | ICD-10-CM | POA: Diagnosis not present

## 2021-12-29 DIAGNOSIS — R109 Unspecified abdominal pain: Secondary | ICD-10-CM | POA: Diagnosis not present

## 2021-12-29 DIAGNOSIS — R9431 Abnormal electrocardiogram [ECG] [EKG]: Secondary | ICD-10-CM | POA: Diagnosis not present

## 2021-12-29 LAB — COMPREHENSIVE METABOLIC PANEL
ALT: 32 U/L (ref 0–44)
AST: 29 U/L (ref 15–41)
Albumin: 4.1 g/dL (ref 3.5–5.0)
Alkaline Phosphatase: 59 U/L (ref 38–126)
Anion gap: 8 (ref 5–15)
BUN: 19 mg/dL (ref 6–20)
CO2: 27 mmol/L (ref 22–32)
Calcium: 8.7 mg/dL — ABNORMAL LOW (ref 8.9–10.3)
Chloride: 104 mmol/L (ref 98–111)
Creatinine, Ser: 1.18 mg/dL (ref 0.61–1.24)
GFR, Estimated: >60 mL/min (ref >60)
Glucose, Bld: 86 mg/dL (ref 70–99)
Potassium: 4.1 mmol/L (ref 3.5–5.1)
Sodium: 139 mmol/L (ref 135–145)
Total Bilirubin: 0.9 mg/dL (ref 0.3–1.2)
Total Protein: 7.2 g/dL (ref 6.5–8.1)

## 2021-12-29 LAB — CBC WITH DIFFERENTIAL/PLATELET
Abs Immature Granulocytes: 0.01 10*3/uL (ref 0.00–0.07)
Basophils Absolute: 0 10*3/uL (ref 0.0–0.1)
Basophils Relative: 1 %
Eosinophils Absolute: 0 10*3/uL (ref 0.0–0.5)
Eosinophils Relative: 1 %
HCT: 44.7 % (ref 39.0–52.0)
Hemoglobin: 15.3 g/dL (ref 13.0–17.0)
Immature Granulocytes: 0 %
Lymphocytes Relative: 24 %
Lymphs Abs: 1.5 10*3/uL (ref 0.7–4.0)
MCH: 31 pg (ref 26.0–34.0)
MCHC: 34.2 g/dL (ref 30.0–36.0)
MCV: 90.7 fL (ref 80.0–100.0)
Monocytes Absolute: 0.6 10*3/uL (ref 0.1–1.0)
Monocytes Relative: 11 %
Neutro Abs: 3.9 10*3/uL (ref 1.7–7.7)
Neutrophils Relative %: 63 %
Platelets: 319 10*3/uL (ref 150–400)
RBC: 4.93 MIL/uL (ref 4.22–5.81)
RDW: 12.7 % (ref 11.5–15.5)
WBC: 6.1 10*3/uL (ref 4.0–10.5)
nRBC: 0 % (ref 0.0–0.2)

## 2021-12-29 LAB — TROPONIN I (HIGH SENSITIVITY): Troponin I (High Sensitivity): <2 ng/L (ref <18)

## 2021-12-29 LAB — LIPASE, BLOOD: Lipase: 37 U/L (ref 11–51)

## 2021-12-29 MED ORDER — IOHEXOL 300 MG/ML  SOLN
100.0000 mL | Freq: Once | INTRAMUSCULAR | Status: AC | PRN
  Administered 2021-12-29: 100 mL via INTRAVENOUS

## 2021-12-29 NOTE — ED Triage Notes (Signed)
RUQ pain x 2 weeks. Pain is under ribs and radiates to right shoulder and down to right testicle. Hurts worse after eating. Denies any fever.

## 2021-12-29 NOTE — Discharge Instructions (Signed)
You came in with right upper abdominal pain. Imaging, labs, and vitals were reassuring and I feel you are safe to go home with regular follow up with your primary care physician. Please take Tylenol 1000 mg 4 times daily as needed and ibuprofen 800 mg three times daily as needed for pain. If your symptoms fail to improve or worsen, please seek medical attention.

## 2021-12-29 NOTE — ED Provider Notes (Signed)
[facility] EMERGENCY DEPARTMENT Provider Note   CSN: [id number] Arrival date & time: 12/29/21  1902     History  Chief Complaint  Patient presents with   Abdominal Pain    Edward Carpenter is a 51 y.o. male. Past Medical History:  Diagnosis Date   Allergy    Diverticulitis    GERD (gastroesophageal reflux disease)      Abdominal Pain Patient reports 2 weeks of dull pain with occasional sharp, shooting pain on the right under rib cage. Working out at home, lifting weights and felt sharp pinch under rib cage. This went away but returned the next morning. Constant dull pain since then. Pain with eating that comes on a few minutes after eating, certain positions (sitting up straight) also exacerbate. No change with defecation. No constipation or diarrhea. No hx of cholecystectomy. When the pain is sharp, shoots down to right testicle and up to right shoulder. This may happen a few times a day. Denies blood in stool. Endorses nausea but no vomiting. Denies appetite suppression or early satiety but stops eating before finishing meals because he's worried about causing pain. Endorses hx of IBS and diverticulitis. Endorses hx of GERD. Pain radiating to back some yesterday. Hx of EGD for heartburn but was normal. Endorses history of colonoscopy with benign polyps. Doesn't take PPI.  Reports mother and grandmother having liver disease (hepatitis C in mother). Denies chest pain or shortness of breath.    Home Medications Prior to Admission medications   Medication Sig Start Date End Date Taking? Authorizing Provider  clonazePAM (KLONOPIN) 0.5 MG tablet TAKE 1/2 TO 1 TABLET BY MOUTH EVERY DAY AT BEDTIME FOR SLEEP AND ANXIETY 08/19/21   Allwardt, Crist Infante, PA-C  Multiple Vitamin (MULTIVITAMIN) capsule Take 1 capsule by mouth daily.    [provider]  naproxen (NAPROSYN) 375 MG tablet Take 1 tablet twice daily as needed for pain. 06/08/21   Molpus, John, MD      Allergies     Prednisone    Review of Systems   Review of Systems  Gastrointestinal:  Positive for abdominal pain.    Physical Exam Updated Vital Signs BP 125/88   Pulse 86   Temp 97.9 F (36.6 C)   Resp 18   Ht 5\' 8"  (1.727 m)   Wt 74.8 kg   SpO2 98%   BMI 25.09 kg/m  Physical Exam Constitutional:      General: He is not in acute distress.    Appearance: He is not ill-appearing.  HENT:     Head: Normocephalic and atraumatic.  Eyes:     General: No scleral icterus. Cardiovascular:     Rate and Rhythm: Normal rate and regular rhythm.     Heart sounds: No murmur heard. Pulmonary:     Effort: Pulmonary effort is normal.     Breath sounds: Normal breath sounds.  Abdominal:     General: Abdomen is flat. Bowel sounds are normal. There is no distension.     Palpations: Abdomen is soft. There is no hepatomegaly.     Tenderness: There is abdominal tenderness in the right upper quadrant. There is no guarding or rebound.  Skin:    General: Skin is warm and dry.  Neurological:     Mental Status: He is alert and oriented to person, place, and time.  Psychiatric:        Mood and Affect: Mood normal.        Behavior: Behavior normal.  ED Results / Procedures / Treatments   Labs (all labs ordered are listed, but only abnormal results are displayed) Labs Reviewed  COMPREHENSIVE METABOLIC PANEL - Abnormal; Notable for the following components:      Result Value   Calcium 8.7 (*)    All other components within normal limits  CBC WITH DIFFERENTIAL/PLATELET  LIPASE, BLOOD  TROPONIN I (HIGH SENSITIVITY)    EKG None  Radiology US Abdomen Limited RUQ (LIVER/GB)  Result Date: 12/29/2021 CLINICAL DATA:  Right upper quadrant pain EXAM: ULTRASOUND ABDOMEN LIMITED RIGHT UPPER QUADRANT COMPARISON:  CT abdomen and pelvis 12/29/2021 FINDINGS: Gallbladder: No gallstones or wall thickening visualized. No sonographic Murphy sign noted by sonographer. Common bile duct: Diameter: 5 mm Liver: No  focal lesion identified. Within normal limits in parenchymal echogenicity. Portal vein is patent on color Doppler imaging with normal direction of blood flow towards the liver. Other: None. IMPRESSION: Unremarkable right upper quadrant ultrasound. Electronically Signed   By: Minerva Fester M.D.   On: 12/29/2021 22:12   CT ABDOMEN PELVIS W CONTRAST  Result Date: 12/29/2021 CLINICAL DATA:  Acute abdominal pain EXAM: CT ABDOMEN AND PELVIS WITH CONTRAST TECHNIQUE: Multidetector CT imaging of the abdomen and pelvis was performed using the standard protocol following bolus administration of intravenous contrast. RADIATION DOSE REDUCTION: This exam was performed according to the departmental dose-optimization program which includes automated exposure control, adjustment of the mA and/or kV according to patient size and/or use of iterative reconstruction technique. CONTRAST:  OMNIPAQUE IOHEXOL 300 MG/ML  SOLN COMPARISON:  08/07/17 FINDINGS: Lower chest: No acute abnormality. Hepatobiliary: No focal liver abnormality is seen. No gallstones, gallbladder wall thickening, or biliary dilatation. Pancreas: Unremarkable. No pancreatic ductal dilatation or surrounding inflammatory changes. Spleen: Normal in size without focal abnormality. Adrenals/Urinary Tract: Adrenal glands are within normal limits. Kidneys demonstrate a normal enhancement pattern bilaterally. No renal calculi or obstructive changes are noted. Bladder is within normal limits. Stomach/Bowel: Diverticular change of the colon is noted without evidence of diverticulitis. The appendix is within normal limits. Stomach and small bowel are unremarkable. Vascular/Lymphatic: No significant vascular findings are present. No enlarged abdominal or pelvic lymph nodes. Reproductive: Prostate is unremarkable. Other: No abdominal wall hernia or abnormality. No abdominopelvic ascites. Musculoskeletal: No acute or significant osseous findings. IMPRESSION: Diverticulosis  without diverticulitis. No acute abnormality noted. Electronically Signed   By: Alcide Clever M.D.   On: 12/29/2021 21:15   DG Chest Port 1 View  Result Date: 12/29/2021 CLINICAL DATA:  Pain in the right ribs radiating to the right shoulder EXAM: PORTABLE CHEST 1 VIEW COMPARISON:  Radiographs 07/29/2020 FINDINGS: The heart size and mediastinal contours are within normal limits. Both lungs are clear. The visualized skeletal structures are unremarkable. IMPRESSION: No active disease. Electronically Signed   By: Minerva Fester M.D.   On: 12/29/2021 20:02    Procedures Procedures    Medications Ordered in ED Medications  iohexol (OMNIPAQUE) 300 MG/ML solution 100 mL (100 mLs Intravenous Contrast Given 12/29/21 2105)    ED Course/ Medical Decision Making/ A&P                           Medical Decision Making Amount and/or Complexity of Data Reviewed Labs: ordered. Radiology: ordered. ECG/medicine tests: ordered.  Risk Prescription drug management.   Patient presents with 2 weeks of right upper quadrant pain that occasionally radiates to right shoulder and right testicle. Ddx includes cholecystitis, acute hepatitis, duodenal ulcer, liver abscess,  pancreatitis, pyelonephritis, LLL PE/PNA, appendicitis, colitis, viral gastroenteritis, mesenteric ischemia, aortic dissection, ruptured AAA, MI. He is non-ill appearing with normal vitals signs. Abdominal exam non-concerning for peritonitis.   Work up so far includes CXR which was normal, ECG nonischemic. Without leukocytosis or anemia. Platelets wnl. RUQ [place], CTAP noncontributory. Workup overall reassuring. Possible that pain is radicular in nature. Will discharge with otc analgesia recommendations and scheduled fu w PCP.          Final Clinical Impression(s) / ED Diagnoses Final diagnoses:  None    Rx / DC Orders ED Discharge Orders     None         Adron Bene, MD 12/29/21 2304    Charlynne Pander, MD 12/30/21  [phone]

## 2022-01-13 ENCOUNTER — Encounter: Payer: Self-pay | Admitting: *Deleted

## 2022-06-03 ENCOUNTER — Encounter: Payer: Self-pay | Admitting: Physician Assistant

## 2022-06-03 ENCOUNTER — Ambulatory Visit (INDEPENDENT_AMBULATORY_CARE_PROVIDER_SITE_OTHER): Payer: BC Managed Care – PPO | Admitting: Physician Assistant

## 2022-06-03 VITALS — BP 126/86 | HR 94 | Temp 97.5°F | Ht 68.0 in | Wt 166.4 lb

## 2022-06-03 DIAGNOSIS — Z125 Encounter for screening for malignant neoplasm of prostate: Secondary | ICD-10-CM | POA: Diagnosis not present

## 2022-06-03 DIAGNOSIS — Z0001 Encounter for general adult medical examination with abnormal findings: Secondary | ICD-10-CM

## 2022-06-03 DIAGNOSIS — Z0000 Encounter for general adult medical examination without abnormal findings: Secondary | ICD-10-CM | POA: Diagnosis not present

## 2022-06-03 DIAGNOSIS — Z13220 Encounter for screening for lipoid disorders: Secondary | ICD-10-CM | POA: Diagnosis not present

## 2022-06-03 DIAGNOSIS — Z23 Encounter for immunization: Secondary | ICD-10-CM | POA: Diagnosis not present

## 2022-06-03 DIAGNOSIS — F419 Anxiety disorder, unspecified: Secondary | ICD-10-CM | POA: Insufficient documentation

## 2022-06-03 DIAGNOSIS — Z131 Encounter for screening for diabetes mellitus: Secondary | ICD-10-CM

## 2022-06-03 LAB — LIPID PANEL
Cholesterol: 209 mg/dL — ABNORMAL HIGH (ref 0–200)
HDL: 42.6 mg/dL (ref >39.00)
NonHDL: 166.27
Total CHOL/HDL Ratio: 5
Triglycerides: 244 mg/dL — ABNORMAL HIGH (ref 0.0–149.0)
VLDL: 48.8 mg/dL — ABNORMAL HIGH (ref 0.0–40.0)

## 2022-06-03 LAB — CBC WITH DIFFERENTIAL/PLATELET
Basophils Absolute: 0 10*3/uL (ref 0.0–0.1)
Basophils Relative: 0.3 % (ref 0.0–3.0)
Eosinophils Absolute: 0 10*3/uL (ref 0.0–0.7)
Eosinophils Relative: 0.7 % (ref 0.0–5.0)
HCT: 45.7 % (ref 39.0–52.0)
Hemoglobin: 15.9 g/dL (ref 13.0–17.0)
Lymphocytes Relative: 22.2 % (ref 12.0–46.0)
Lymphs Abs: 1.2 10*3/uL (ref 0.7–4.0)
MCHC: 34.7 g/dL (ref 30.0–36.0)
MCV: 91.5 fl (ref 78.0–100.0)
Monocytes Absolute: 0.6 10*3/uL (ref 0.1–1.0)
Monocytes Relative: 11.9 % (ref 3.0–12.0)
Neutro Abs: 3.5 10*3/uL (ref 1.4–7.7)
Neutrophils Relative %: 64.9 % (ref 43.0–77.0)
Platelets: 295 10*3/uL (ref 150.0–400.0)
RBC: 5 Mil/uL (ref 4.22–5.81)
RDW: 13.3 % (ref 11.5–15.5)
WBC: 5.4 10*3/uL (ref 4.0–10.5)

## 2022-06-03 LAB — COMPREHENSIVE METABOLIC PANEL
ALT: 31 U/L (ref 0–53)
AST: 23 U/L (ref 0–37)
Albumin: 4.5 g/dL (ref 3.5–5.2)
Alkaline Phosphatase: 67 U/L (ref 39–117)
BUN: 26 mg/dL — ABNORMAL HIGH (ref 6–23)
CO2: 26 mEq/L (ref 19–32)
Calcium: 9.5 mg/dL (ref 8.4–10.5)
Chloride: 103 mEq/L (ref 96–112)
Creatinine, Ser: 1.11 mg/dL (ref 0.40–1.50)
GFR: 76.93 mL/min (ref >60.00)
Glucose, Bld: 93 mg/dL (ref 70–99)
Potassium: 4.1 mEq/L (ref 3.5–5.1)
Sodium: 140 mEq/L (ref 135–145)
Total Bilirubin: 0.9 mg/dL (ref 0.2–1.2)
Total Protein: 7.3 g/dL (ref 6.0–8.3)

## 2022-06-03 LAB — PSA: PSA: 0.74 ng/mL (ref 0.10–4.00)

## 2022-06-03 LAB — TSH: TSH: 3.26 u[IU]/mL (ref 0.35–5.50)

## 2022-06-03 LAB — HEMOGLOBIN A1C: Hgb A1c MFr Bld: 5.4 % (ref 4.6–6.5)

## 2022-06-03 LAB — LDL CHOLESTEROL, DIRECT: Direct LDL: 145 mg/dL

## 2022-06-03 MED ORDER — CLONAZEPAM 0.5 MG PO TABS: ORAL_TABLET | ORAL | 2 refills | Status: DC

## 2022-06-03 MED ORDER — CLONAZEPAM 0.5 MG PO TABS: ORAL_TABLET | ORAL | 1 refills | Status: DC

## 2022-06-03 MED ORDER — TRAZODONE HCL 50 MG PO TABS: 25.0000 mg | ORAL_TABLET | Freq: Every evening | ORAL | 3 refills | Status: DC | PRN

## 2022-06-03 NOTE — Progress Notes (Signed)
Subjective:    Patient ID: Edward Carpenter, male    DOB: [birth year], 52 y.o.   MRN: [id number]  Chief Complaint  Patient presents with   Annual Exam    Pt in office for annual exam; pt seen in ED due to right side abdominal pain; nothing found to cause; advised to speak with PCP about nerve pains; has hx of anxiety as well not knowing if this is causing the pain;     HPI Patient is in today for annual exam.  Acute concerns: Still having trouble 'turning brain off at night' to sleep   Health maintenance: Lifestyle/ exercise: Treadmill daily, weights every few days Nutrition: Cooking at home  Mental health: Anxious in evenings Sleep: Does well if he takes Klonopin prior to bedtime, otherwise tossing and turning. Doesn't want to take it all the time and make a habit out of it.  Substance use: None ETOH: glass of wine with wife on Saturdays  Sexual activity: monogamous  Immunizations: Updating Tdap today  Colonoscopy: [facility] - hx diverticulosis  Skin: no changes or concerns    Past Medical History:  Diagnosis Date   Allergy    Anxiety 2010   Diverticulitis    GERD (gastroesophageal reflux disease)     History reviewed. No pertinent surgical history.  Family History  Problem Relation Age of Onset   Valvular heart disease Mother    Anxiety disorder Mother    Stroke Father    Anxiety disorder Sister     Social History   Tobacco Use   Smoking status: Never   Smokeless tobacco: Never  Substance Use Topics   Alcohol use: Yes    Alcohol/week: 4.0 standard drinks of alcohol    Types: 4 Glasses of wine per week   Drug use: No     Allergies  Allergen Reactions   Prednisone Other (See Comments)    Review of Systems NEGATIVE UNLESS OTHERWISE INDICATED IN HPI      Objective:     BP 126/86 (BP Location: Left Arm)   Pulse 94   Temp (!) 97.5 F (36.4 C) (Temporal)   Ht 5\' 8"  (1.727 m)   Wt 166 lb 6.4 oz (75.5 kg)   SpO2 98%   BMI 25.30 kg/m    Wt Readings from Last 3 Encounters:  06/03/22 166 lb 6.4 oz (75.5 kg)  12/29/21 165 lb (74.8 kg)  06/08/21 161 lb (73 kg)    BP Readings from Last 3 Encounters:  06/03/22 126/86  12/29/21 112/81  06/08/21 125/71     Physical Exam Vitals and nursing note reviewed.  Constitutional:      General: He is not in acute distress.    Appearance: Normal appearance. He is not toxic-appearing.  HENT:     Head: Normocephalic and atraumatic.     Right Ear: Tympanic membrane, ear canal and external ear normal.     Left Ear: Tympanic membrane, ear canal and external ear normal.     Nose: Nose normal.     Mouth/Throat:     Mouth: Mucous membranes are moist.     Pharynx: Oropharynx is clear.  Eyes:     Extraocular Movements: Extraocular movements intact.     Conjunctiva/sclera: Conjunctivae normal.     Pupils: Pupils are equal, round, and reactive to light.  Cardiovascular:     Rate and Rhythm: Normal rate and regular rhythm.     Pulses: Normal pulses.     Heart sounds: Normal heart sounds.  Pulmonary:     Effort: Pulmonary effort is normal.     Breath sounds: Normal breath sounds.  Abdominal:     General: Abdomen is flat. Bowel sounds are normal.     Palpations: Abdomen is soft.     Tenderness: There is no abdominal tenderness.  Musculoskeletal:        General: Normal range of motion.     Cervical back: Normal range of motion and neck supple.     Right lower leg: No edema.     Left lower leg: No edema.  Skin:    General: Skin is warm and dry.     Findings: No lesion or rash.  Neurological:     General: No focal deficit present.     Mental Status: He is alert and oriented to person, place, and time.  Psychiatric:        Mood and Affect: Mood normal.        Behavior: Behavior normal.        Assessment & Plan:  Encounter for physical examination  Encounter for annual physical exam -     CBC with Differential/Platelet -     Comprehensive metabolic panel -     Hemoglobin  A1c -     Lipid panel -     PSA -     TSH -     LDL cholesterol, direct  Anxiety Assessment & Plan: Worse in the evenings Occasional panic in the day Klonopin 0.5 mg as directed for panic / severe issues falling asleep PDMP reviewed today, no red flags, filling appropriately.  Refilled today  Will trial Trazodone 25 - 50 mg at bedtime qhs prn in place of klonopin, pt to let me know how this works for him. Pt aware of risks vs benefits and possible adverse reactions.   Sleep hygiene in AVS.   Orders: -     clonazePAM; TAKE 1/2 TO 1 TABLET BY MOUTH EVERY DAY AT BEDTIME FOR SLEEP AND ANXIETY  Dispense: 30 tablet; Refill: 2 -     traZODone HCl; Take 0.5-1 tablets (25-50 mg total) by mouth at bedtime as needed for sleep.  Dispense: 30 tablet; Refill: 3  Need for prophylactic vaccination with combined diphtheria-tetanus-pertussis (DTP) vaccine -     Tdap vaccine greater than or equal to 7yo IM   Age-appropriate screening and counseling performed today. Will check labs and call with results. Preventive measures discussed and printed in AVS for patient.   Patient Counseling: [x]   Nutrition: Stressed importance of moderation in sodium/caffeine intake, saturated fat and cholesterol, caloric balance, sufficient intake of fresh fruits, vegetables, and fiber.  [x]   Stressed the importance of regular exercise.   [x]   Substance Abuse: Discussed cessation/primary prevention of tobacco, alcohol, or other drug use; driving or other dangerous activities under the influence; availability of treatment for abuse.   []   Injury prevention: Discussed safety belts, safety helmets, smoke detector, smoking near bedding or upholstery.   []   Sexuality: Discussed sexually transmitted diseases, partner selection, use of condoms, avoidance of unintended pregnancy  and contraceptive alternatives.   [x]   Dental health: Discussed importance of regular tooth brushing, flossing, and dental visits.  [x]   Health  maintenance and immunizations reviewed. Please refer to Health maintenance section.       Return in about 1 year (around 06/03/2023) for physical.    Saryiah Bencosme M Eleonor Ocon, PA-C

## 2022-06-03 NOTE — Assessment & Plan Note (Addendum)
Worse in the evenings Occasional panic in the day Klonopin 0.5 mg as directed for panic / severe issues falling asleep PDMP reviewed today, no red flags, filling appropriately.  Refilled today  Will trial Trazodone 25 - 50 mg at bedtime qhs prn in place of klonopin, pt to let me know how this works for him. Pt aware of risks vs benefits and possible adverse reactions.   Sleep hygiene in AVS.

## 2022-06-03 NOTE — Patient Instructions (Signed)
Please work on the following to help with sleep:  -Sleep only long enough to feel rested then get out of bed -Go to bed and get up at the same time every day. -Do not try to force yourself to sleep. If you can't sleep, get out of bed and try again later. -Have coffee, tea, and other foods that have caffeine only in the morning. -Avoid alcohol -Keep your bedroom dark, cool, quiet, and free of reminders of work or other things that cause you stress -Exercise several days a week, but not right before bed -Avoid looking at phones or reading devices ("e-books") that give off light before bed. This can make it harder to fall asleep   You may trial Trazodone in place of klonopin for sleep issues. Let me know how this works out for you in a few weeks through [employer]!

## 2022-06-06 ENCOUNTER — Other Ambulatory Visit: Payer: Self-pay | Admitting: Physician Assistant

## 2022-06-06 DIAGNOSIS — E7800 Pure hypercholesterolemia, unspecified: Secondary | ICD-10-CM

## 2022-06-06 DIAGNOSIS — R799 Abnormal finding of blood chemistry, unspecified: Secondary | ICD-10-CM

## 2022-06-06 NOTE — Telephone Encounter (Signed)
Please see pt msg and advise if I should need to order future labs.

## 2022-06-10 ENCOUNTER — Other Ambulatory Visit (INDEPENDENT_AMBULATORY_CARE_PROVIDER_SITE_OTHER): Payer: BC Managed Care – PPO

## 2022-06-10 DIAGNOSIS — R799 Abnormal finding of blood chemistry, unspecified: Secondary | ICD-10-CM | POA: Diagnosis not present

## 2022-06-10 DIAGNOSIS — E7800 Pure hypercholesterolemia, unspecified: Secondary | ICD-10-CM | POA: Diagnosis not present

## 2022-06-10 LAB — BASIC METABOLIC PANEL
BUN: 19 mg/dL (ref 6–23)
CO2: 27 mEq/L (ref 19–32)
Calcium: 9.4 mg/dL (ref 8.4–10.5)
Chloride: 100 mEq/L (ref 96–112)
Creatinine, Ser: 1.11 mg/dL (ref 0.40–1.50)
GFR: 76.92 mL/min (ref >60.00)
Glucose, Bld: 92 mg/dL (ref 70–99)
Potassium: 4.3 mEq/L (ref 3.5–5.1)
Sodium: 137 mEq/L (ref 135–145)

## 2022-06-10 LAB — LIPID PANEL
Cholesterol: 227 mg/dL — ABNORMAL HIGH (ref 0–200)
HDL: 44.1 mg/dL (ref >39.00)
LDL Cholesterol: 144 mg/dL — ABNORMAL HIGH (ref 0–99)
NonHDL: 182.98
Total CHOL/HDL Ratio: 5
Triglycerides: 197 mg/dL — ABNORMAL HIGH (ref 0.0–149.0)
VLDL: 39.4 mg/dL (ref 0.0–40.0)

## 2022-06-25 ENCOUNTER — Other Ambulatory Visit: Payer: Self-pay | Admitting: Physician Assistant

## 2022-06-25 DIAGNOSIS — F419 Anxiety disorder, unspecified: Secondary | ICD-10-CM

## 2022-08-12 IMAGING — DX DG FOOT COMPLETE 3+V*R*
3 series · 3 of 3 positions shown · non-contrast
Comparison: None Available.

CLINICAL DATA: Kicked door.  Foot pain

EXAM:
RIGHT FOOT COMPLETE - 3+ VIEW

[foot ap]
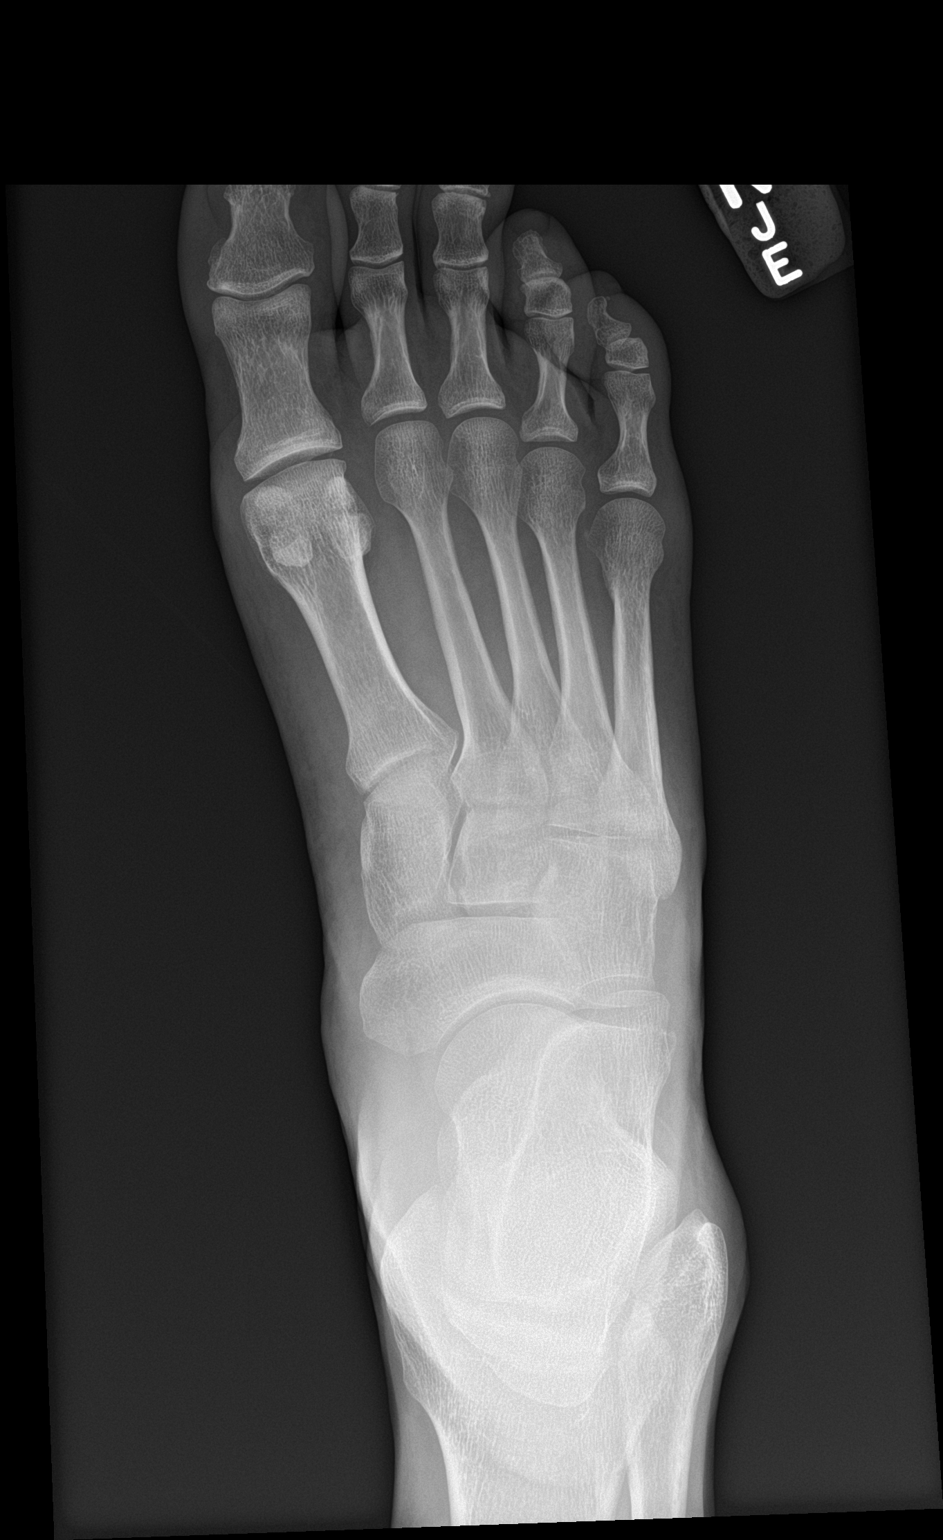

[foot obl]
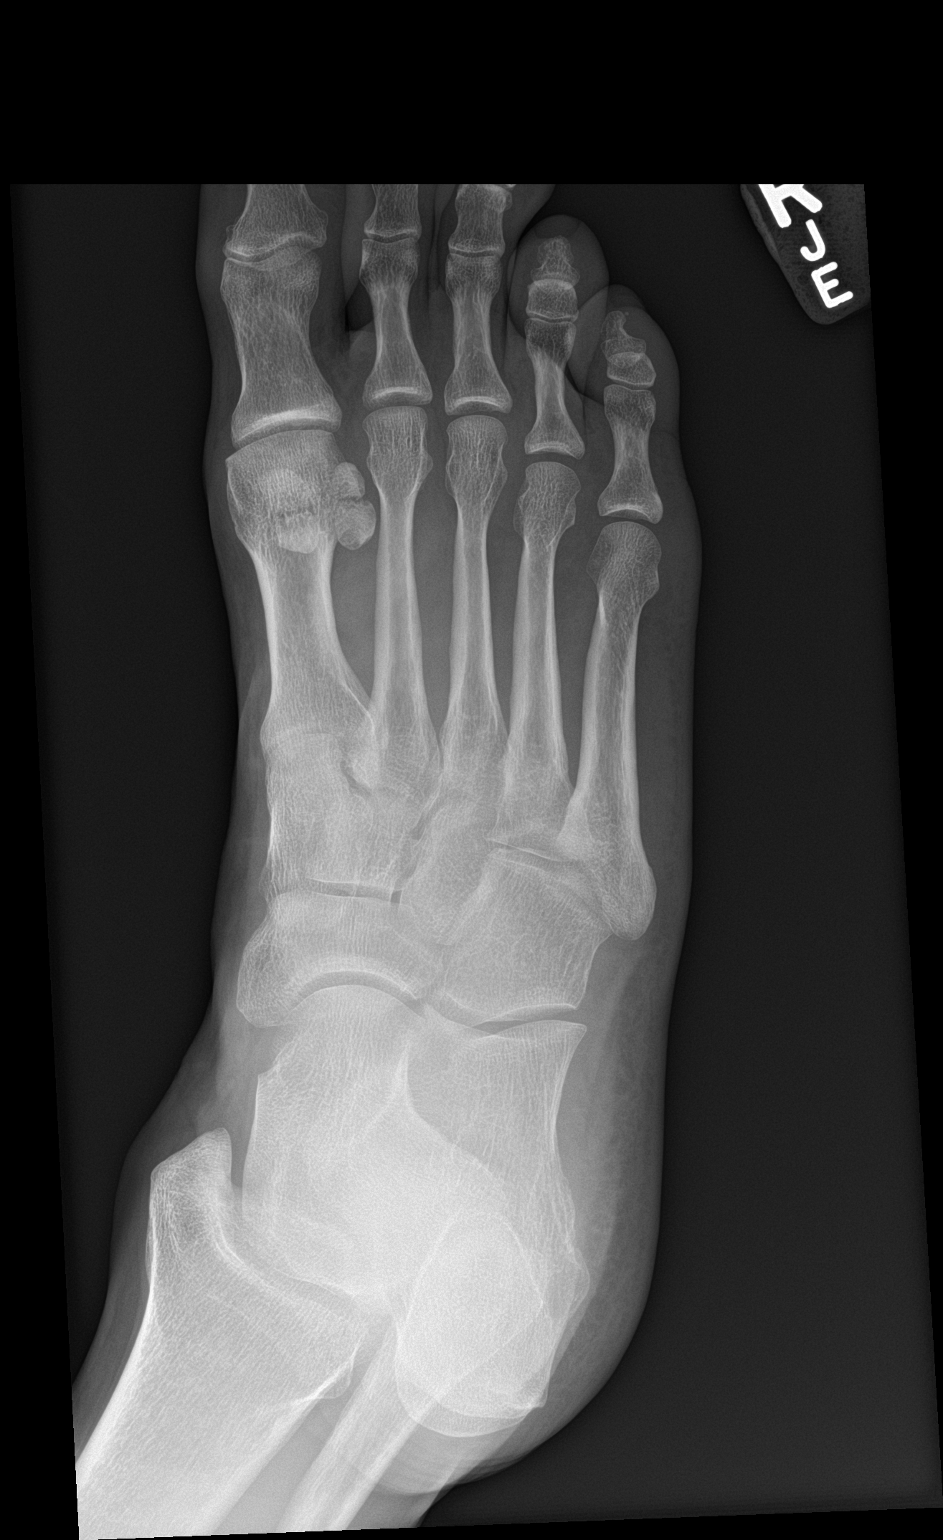

[foot lat]
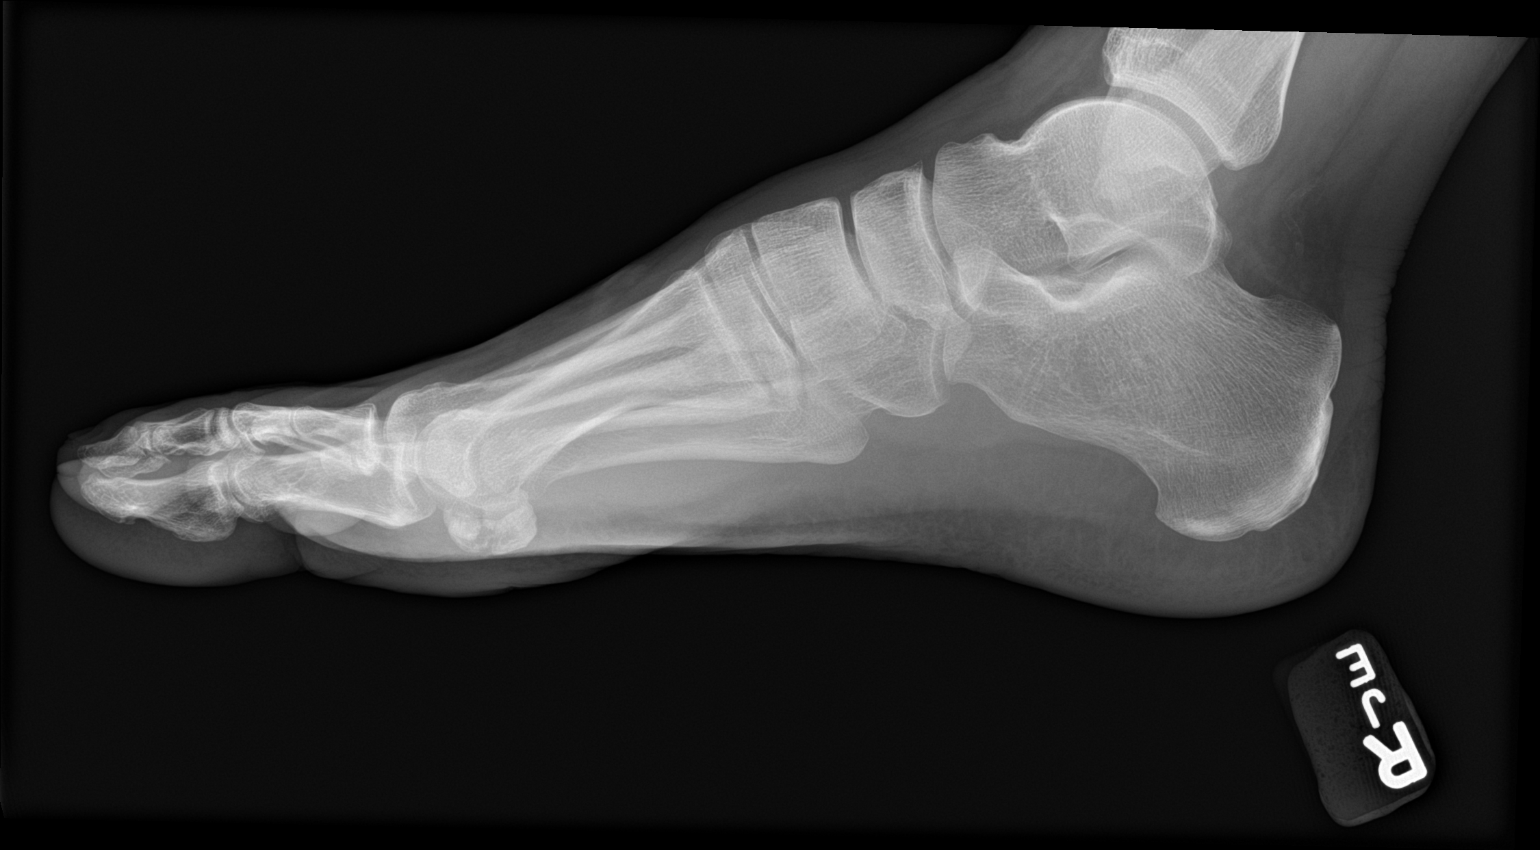

[3 of 3 positions shown; findings below may reference images not displayed]

FINDINGS: There is no evidence of fracture or dislocation. There is no
evidence of arthropathy or other focal bone abnormality. Soft
tissues are unremarkable.
IMPRESSION: Negative.

## 2022-08-30 ENCOUNTER — Encounter: Payer: Self-pay | Admitting: Physician Assistant

## 2022-08-30 ENCOUNTER — Ambulatory Visit: Payer: BC Managed Care – PPO | Admitting: Physician Assistant

## 2022-08-30 ENCOUNTER — Ambulatory Visit (HOSPITAL_BASED_OUTPATIENT_CLINIC_OR_DEPARTMENT_OTHER): Admission: RE | Admit: 2022-08-30 | Payer: BC Managed Care – PPO | Source: Ambulatory Visit

## 2022-08-30 VITALS — BP 136/72 | HR 96 | Temp 97.8°F | Wt 157.8 lb

## 2022-08-30 DIAGNOSIS — R1011 Right upper quadrant pain: Secondary | ICD-10-CM

## 2022-08-30 LAB — COMPREHENSIVE METABOLIC PANEL
ALT: 25 U/L (ref 0–53)
AST: 21 U/L (ref 0–37)
Albumin: 4.6 g/dL (ref 3.5–5.2)
Alkaline Phosphatase: 63 U/L (ref 39–117)
BUN: 16 mg/dL (ref 6–23)
CO2: 28 mEq/L (ref 19–32)
Calcium: 9.6 mg/dL (ref 8.4–10.5)
Chloride: 103 mEq/L (ref 96–112)
Creatinine, Ser: 1.06 mg/dL (ref 0.40–1.50)
GFR: 81.17 mL/min (ref >60.00)
Glucose, Bld: 79 mg/dL (ref 70–99)
Potassium: 4 mEq/L (ref 3.5–5.1)
Sodium: 139 mEq/L (ref 135–145)
Total Bilirubin: 0.7 mg/dL (ref 0.2–1.2)
Total Protein: 7.3 g/dL (ref 6.0–8.3)

## 2022-08-30 LAB — CBC WITH DIFFERENTIAL/PLATELET
Basophils Absolute: 0 10*3/uL (ref 0.0–0.1)
Basophils Relative: 0.5 % (ref 0.0–3.0)
Eosinophils Absolute: 0 10*3/uL (ref 0.0–0.7)
Eosinophils Relative: 0.6 % (ref 0.0–5.0)
HCT: 47.4 % (ref 39.0–52.0)
Hemoglobin: 15.9 g/dL (ref 13.0–17.0)
Lymphocytes Relative: 19.5 % (ref 12.0–46.0)
Lymphs Abs: 0.9 10*3/uL (ref 0.7–4.0)
MCHC: 33.5 g/dL (ref 30.0–36.0)
MCV: 92.4 fl (ref 78.0–100.0)
Monocytes Absolute: 0.6 10*3/uL (ref 0.1–1.0)
Monocytes Relative: 12.9 % — ABNORMAL HIGH (ref 3.0–12.0)
Neutro Abs: 3.1 10*3/uL (ref 1.4–7.7)
Neutrophils Relative %: 66.5 % (ref 43.0–77.0)
Platelets: 323 10*3/uL (ref 150.0–400.0)
RBC: 5.13 Mil/uL (ref 4.22–5.81)
RDW: 13.5 % (ref 11.5–15.5)
WBC: 4.6 10*3/uL (ref 4.0–10.5)

## 2022-08-30 LAB — LIPASE: Lipase: 45 U/L (ref 11.0–59.0)

## 2022-08-30 NOTE — Progress Notes (Signed)
Subjective:    Patient ID: Edward Carpenter, male    DOB: 03/23/1970, 52 y.o.   MRN: [id number]  Chief Complaint  Patient presents with   Rib pain     Right side intermittent pain consist past couple of days    HPI Patient is in today for intermittent RUQ pain. Feels like severe heartburn sometimes, associated with nausea sometimes, currently 1/10 today, sometimes 8/10 and wakes him up in the night. Pain started after an accidental fall in the bathroom about 4 weeks ago, landed on the bathtub.    Some abdominal cramping, denies any bowel changes. No urinary changes. No fever or chills. No vomiting, just some nausea.   Pain sometimes radiates to the back.   Pain 7-8/10 last night, told wife he might need to go to the ER. Then pain subsided before going there.   Pt reports he had similar pain last year, was worked up in ER, everything negative, was told maybe nerve pain.   Past Medical History:  Diagnosis Date   Allergy    Anxiety 2010   Diverticulitis    GERD (gastroesophageal reflux disease)     No past surgical history on file.  Family History  Problem Relation Age of Onset   Valvular heart disease Mother    Anxiety disorder Mother    Stroke Father    Anxiety disorder Sister     Social History   Tobacco Use   Smoking status: Never   Smokeless tobacco: Never  Vaping Use   Vaping status: Never Used  Substance Use Topics   Alcohol use: Yes    Alcohol/week: 4.0 standard drinks of alcohol    Types: 4 Glasses of wine per week   Drug use: No     Allergies  Allergen Reactions   Prednisone Other (See Comments)    Review of Systems NEGATIVE UNLESS OTHERWISE INDICATED IN HPI      Objective:     BP 136/72   Pulse 96   Temp 97.8 F (36.6 C) (Temporal)   Wt 157 lb 12.8 oz (71.6 kg)   SpO2 97%   BMI 23.99 kg/m   Wt Readings from Last 3 Encounters:  08/30/22 157 lb 12.8 oz (71.6 kg)  06/03/22 166 lb 6.4 oz (75.5 kg)  12/29/21 165 lb (74.8 kg)    BP  Readings from Last 3 Encounters:  08/30/22 136/72  06/03/22 126/86  12/29/21 112/81     Physical Exam Vitals and nursing note reviewed.  Constitutional:      Appearance: Normal appearance.  Eyes:     General: No scleral icterus.    Extraocular Movements: Extraocular movements intact.     Conjunctiva/sclera: Conjunctivae normal.     Pupils: Pupils are equal, round, and reactive to light.  Cardiovascular:     Rate and Rhythm: Normal rate and regular rhythm.     Pulses: Normal pulses.  Pulmonary:     Effort: Pulmonary effort is normal. No respiratory distress.     Breath sounds: Normal breath sounds.  Abdominal:     Palpations: There is no mass.     Tenderness: There is abdominal tenderness in the right upper quadrant. There is guarding. There is no right CVA tenderness or left CVA tenderness.  Skin:    Findings: No rash.  Neurological:     General: No focal deficit present.     Mental Status: He is alert.  Psychiatric:        Mood and Affect: Mood normal.  Assessment & Plan:  RUQ abdominal pain -     US ABDOMEN LIMITED RUQ (LIVER/GB); Future -     CBC with Differential/Platelet -     Comprehensive metabolic panel -     Lipase   Suspect possible gallbladder disease vs abdominal abscess versus abdominal hematoma (secondary to the recent fall), versus other right upper quadrant abdominal etiology.  Plan for STAT CBC, CMP, lipase, ultrasound abdomen right upper quadrant.  Treat pending results.  Should symptoms suddenly worsen or change, he will go straight to the ER. Pt agreeable and understanding of plan.      Return if symptoms worsen or fail to improve.   Niya Behler M Shyheem Whitham, PA-C

## 2022-08-31 ENCOUNTER — Encounter: Payer: Self-pay | Admitting: Physician Assistant

## 2022-08-31 ENCOUNTER — Ambulatory Visit (HOSPITAL_BASED_OUTPATIENT_CLINIC_OR_DEPARTMENT_OTHER)
Admission: RE | Admit: 2022-08-31 | Discharge: 2022-08-31 | Disposition: A | Discharge status: Home / Self Care | Payer: BC Managed Care – PPO | Source: Ambulatory Visit | Attending: Physician Assistant | Admitting: Physician Assistant

## 2022-08-31 DIAGNOSIS — K7689 Other specified diseases of liver: Secondary | ICD-10-CM | POA: Diagnosis not present

## 2022-08-31 DIAGNOSIS — R1011 Right upper quadrant pain: Secondary | ICD-10-CM | POA: Insufficient documentation

## 2022-08-31 NOTE — Telephone Encounter (Signed)
See note

## 2023-02-17 NOTE — Telephone Encounter (Signed)
Please see pt msg in regards to DR question and advise patient

## 2023-02-20 NOTE — Telephone Encounter (Signed)
Please contact patient and schedule visit with PCP to discuss

## 2023-03-13 ENCOUNTER — Encounter: Payer: Self-pay | Admitting: Physician Assistant

## 2023-03-13 ENCOUNTER — Ambulatory Visit: Payer: BC Managed Care – PPO | Admitting: Physician Assistant

## 2023-03-13 VITALS — BP 124/88 | HR 88 | Temp 97.0°F | Ht 68.0 in | Wt 151.0 lb

## 2023-03-13 DIAGNOSIS — R1011 Right upper quadrant pain: Secondary | ICD-10-CM

## 2023-03-13 DIAGNOSIS — R634 Abnormal weight loss: Secondary | ICD-10-CM | POA: Diagnosis not present

## 2023-03-13 DIAGNOSIS — R110 Nausea: Secondary | ICD-10-CM

## 2023-03-13 DIAGNOSIS — K760 Fatty (change of) liver, not elsewhere classified: Secondary | ICD-10-CM | POA: Diagnosis not present

## 2023-03-13 LAB — CBC WITH DIFFERENTIAL/PLATELET
Basophils Absolute: 0 10*3/uL (ref 0.0–0.1)
Basophils Relative: 0.6 % (ref 0.0–3.0)
Eosinophils Absolute: 0 10*3/uL (ref 0.0–0.7)
Eosinophils Relative: 0.3 % (ref 0.0–5.0)
HCT: 46.8 % (ref 39.0–52.0)
Hemoglobin: 15.7 g/dL (ref 13.0–17.0)
Lymphocytes Relative: 20.2 % (ref 12.0–46.0)
Lymphs Abs: 0.9 10*3/uL (ref 0.7–4.0)
MCHC: 33.6 g/dL (ref 30.0–36.0)
MCV: 92.3 fL (ref 78.0–100.0)
Monocytes Absolute: 0.5 10*3/uL (ref 0.1–1.0)
Monocytes Relative: 11.3 % (ref 3.0–12.0)
Neutro Abs: 3.2 10*3/uL (ref 1.4–7.7)
Neutrophils Relative %: 67.6 % (ref 43.0–77.0)
Platelets: 317 10*3/uL (ref 150.0–400.0)
RBC: 5.07 Mil/uL (ref 4.22–5.81)
RDW: 13.2 % (ref 11.5–15.5)
WBC: 4.7 10*3/uL (ref 4.0–10.5)

## 2023-03-13 LAB — POC URINALSYSI DIPSTICK (AUTOMATED)
Bilirubin, UA: NEGATIVE
Blood, UA: NEGATIVE
Glucose, UA: NEGATIVE
Ketones, UA: NEGATIVE
Leukocytes, UA: NEGATIVE
Nitrite, UA: NEGATIVE
Protein, UA: NEGATIVE
Spec Grav, UA: 1.015 (ref 1.010–1.025)
Urobilinogen, UA: 0.2 U/dL
pH, UA: 6 (ref 5.0–8.0)

## 2023-03-13 LAB — COMPREHENSIVE METABOLIC PANEL
ALT: 54 U/L — ABNORMAL HIGH (ref 0–53)
AST: 41 U/L — ABNORMAL HIGH (ref 0–37)
Albumin: 4.5 g/dL (ref 3.5–5.2)
Alkaline Phosphatase: 73 U/L (ref 39–117)
BUN: 16 mg/dL (ref 6–23)
CO2: 26 meq/L (ref 19–32)
Calcium: 9.5 mg/dL (ref 8.4–10.5)
Chloride: 103 meq/L (ref 96–112)
Creatinine, Ser: 0.98 mg/dL (ref 0.40–1.50)
GFR: 88.85 mL/min (ref >60.00)
Glucose, Bld: 93 mg/dL (ref 70–99)
Potassium: 3.9 meq/L (ref 3.5–5.1)
Sodium: 141 meq/L (ref 135–145)
Total Bilirubin: 0.8 mg/dL (ref 0.2–1.2)
Total Protein: 7.4 g/dL (ref 6.0–8.3)

## 2023-03-13 LAB — AMYLASE: Amylase: 45 U/L (ref 27–131)

## 2023-03-13 LAB — LIPASE: Lipase: 29 U/L (ref 11.0–59.0)

## 2023-03-13 LAB — TSH: TSH: 2.43 u[IU]/mL (ref 0.35–5.50)

## 2023-03-13 NOTE — Patient Instructions (Addendum)
 Call your GI for an appointment: Dr. Felecia Hopper Address: [address], [place], [place] [address] Phone: [phone]  Labs today  Your will be contacted to schedule your HIDA scan to assess gallbladder function

## 2023-03-13 NOTE — Progress Notes (Signed)
 Patient ID: Edward Carpenter, male    DOB: 07-30-1970, 53 y.o.   MRN: [id number]   Assessment & Plan:  RUQ abdominal pain -     NM Hepato W/EF; Future -     CBC with Differential/Platelet -     Comprehensive metabolic panel -     TSH -     Lipase -     Amylase -     H. pylori breath test -     POCT Urinalysis Dipstick (Automated)  Nausea -     CBC with Differential/Platelet -     Comprehensive metabolic panel -     TSH -     Lipase -     Amylase -     H. pylori breath test -     POCT Urinalysis Dipstick (Automated)  Fatty liver -     CBC with Differential/Platelet -     Comprehensive metabolic panel -     TSH -     Lipase -     Amylase -     H. pylori breath test  Abnormal weight loss -     CBC with Differential/Platelet -     Comprehensive metabolic panel -     TSH -     Lipase -     Amylase -     H. pylori breath test -     POCT Urinalysis Dipstick (Automated)     Assessment and Plan    Right Upper Quadrant Pain Persistent pain, described as a deep, inflating sensation, with occasional stabbing pain on deep inspiration. Pain is worse at night and in the early morning. Pain has been present for several months, with a recent decrease in intensity. No associated nausea, vomiting, or changes in bowel habits. Patient has lost weight due to dietary changes and fear of exacerbating the pain. No history of gallstones, but a previous HIDA scan was performed three years ago with normal results. Patient had a fall in the bathtub in July of the previous year, which may have exacerbated the pain. -Order repeat blood work including liver markers, pancreas markers, and white blood cell count. -Order repeat HIDA scan to assess gallbladder function. -Refer to gastroenterologist for further evaluation and possible endoscopy. -Check for H. pylori infection with a breath test. -Perform a quick dip urine test to rule out any urinary abnormalities.  Anxiety Patient reports a  history of anxiety and is currently experiencing some low mood, possibly related to ongoing health concerns and lifestyle changes. -Consider referral to mental health services if mood does not improve or worsens.       Subjective:    Chief Complaint  Patient presents with   Medical Management of Chronic Issues    Pt in office to discuss MRI with PCP; pt fell in bathtub in October, discussed with PCP; pt found mild fatty liver, pain still persistent, and wants to discuss ordering an MRI.  Pt not taking meds due to fear it is reason for liver pain; not able to work out, etc    HPI   History of Present Illness   Edward Carpenter is a 53 year old male who presents with persistent right upper quadrant abdominal pain.  He has been experiencing persistent right upper quadrant abdominal pain since October, described as a sensation of 'inflating a balloon from the inside out' and sometimes like a 'fist going from the inside out.' The pain is most intense between 3 AM and 7-8 AM, decreasing in  intensity once he gets up and goes to work. Occasionally, the pain radiates to the back but does not extend to the shoulder or shoulder blade. The pain was most intense in December but has slightly improved over the past two weeks.  Since October, he has made several lifestyle changes, including discontinuing wine consumption and stopping workouts, due to fear of exacerbating the pain. Despite these changes, the pain persists. He has also lost weight, dropping from 163-164 pounds to 149-150 pounds, which he attributes to eating healthier and reducing portion sizes. He denies any changes in appetite but notes a loss of muscle mass.  He has a history of a fall in July last year, where he hit his right side in the bathtub, which he suspects may have contributed to his current condition. Previous diagnostic workups, including a sonogram and a three-hour x-ray, did not reveal any abnormalities with his gallbladder or  liver.  He experiences occasional nausea, particularly once or twice a week, but denies any vomiting, night sweats, or changes in bowel or urinary habits. He also reports a red stain under his eye that correlates with the intensity of the pain. No skin changes elsewhere, and there is no history of blood in stool or urine.  His family history includes cholesterol issues on his father's side and digestive issues such as intestinal inflammation and IBS on his mother's side. He is cautious about taking statins due to a family history of Alzheimer's disease associated with his use.       Past Medical History:  Diagnosis Date   Allergy    Anxiety 2010   Diverticulitis    GERD (gastroesophageal reflux disease)     History reviewed. No pertinent surgical history.  Family History  Problem Relation Age of Onset   Valvular heart disease Mother    Anxiety disorder Mother    Stroke Father    Anxiety disorder Sister     Social History   Tobacco Use   Smoking status: Never   Smokeless tobacco: Never  Vaping Use   Vaping status: Never Used  Substance Use Topics   Alcohol use: Yes    Alcohol/week: 4.0 standard drinks of alcohol    Types: 4 Glasses of wine per week   Drug use: No     Allergies  Allergen Reactions   Prednisone Other (See Comments)    Review of Systems NEGATIVE UNLESS OTHERWISE INDICATED IN HPI      Objective:     BP 124/88 (BP Location: Left Arm, Patient Position: Sitting, Cuff Size: Normal)   Pulse 88   Temp (!) 97 F (36.1 C) (Temporal)   Ht 5\' 8"  (1.727 m)   Wt 151 lb (68.5 kg)   SpO2 98%   BMI 22.96 kg/m   Wt Readings from Last 3 Encounters:  03/13/23 151 lb (68.5 kg)  08/30/22 157 lb 12.8 oz (71.6 kg)  06/03/22 166 lb 6.4 oz (75.5 kg)    BP Readings from Last 3 Encounters:  03/13/23 124/88  08/30/22 136/72  06/03/22 126/86     Physical Exam Vitals and nursing note reviewed.  Constitutional:      Appearance: Normal appearance.  HENT:      Mouth/Throat:     Mouth: Mucous membranes are moist.  Eyes:     General: No scleral icterus.    Extraocular Movements: Extraocular movements intact.     Conjunctiva/sclera: Conjunctivae normal.     Pupils: Pupils are equal, round, and reactive to light.  Cardiovascular:  Rate and Rhythm: Normal rate and regular rhythm.     Pulses: Normal pulses.  Pulmonary:     Effort: Pulmonary effort is normal. No respiratory distress.     Breath sounds: Normal breath sounds.  Abdominal:     Palpations: There is no mass.     Tenderness: There is no abdominal tenderness. There is no right CVA tenderness, left CVA tenderness or guarding.     Comments: (RUQ pain is not flared up today per patient, exam benign today).   Skin:    Findings: No rash.  Neurological:     General: No focal deficit present.     Mental Status: He is alert.  Psychiatric:        Mood and Affect: Mood normal.         Graceland Wachter M Harper Vandervoort, PA-C

## 2023-03-14 ENCOUNTER — Other Ambulatory Visit: Payer: Self-pay

## 2023-03-14 DIAGNOSIS — R7989 Other specified abnormal findings of blood chemistry: Secondary | ICD-10-CM

## 2023-03-14 LAB — H. PYLORI BREATH TEST: H. pylori Breath Test: NOT DETECTED

## 2023-04-02 ENCOUNTER — Other Ambulatory Visit: Payer: Self-pay | Admitting: Physician Assistant

## 2023-04-02 DIAGNOSIS — F419 Anxiety disorder, unspecified: Secondary | ICD-10-CM

## 2023-04-03 ENCOUNTER — Encounter (HOSPITAL_COMMUNITY)
Admission: RE | Admit: 2023-04-03 | Discharge: 2023-04-03 | Disposition: A | Discharge status: Home / Self Care | Payer: BC Managed Care – PPO | Source: Ambulatory Visit | Attending: Physician Assistant | Admitting: Physician Assistant

## 2023-04-03 DIAGNOSIS — R1011 Right upper quadrant pain: Secondary | ICD-10-CM | POA: Insufficient documentation

## 2023-04-03 DIAGNOSIS — R1010 Upper abdominal pain, unspecified: Secondary | ICD-10-CM | POA: Diagnosis not present

## 2023-04-03 DIAGNOSIS — G8929 Other chronic pain: Secondary | ICD-10-CM | POA: Diagnosis not present

## 2023-04-03 MED ORDER — TECHNETIUM TC 99M MEBROFENIN IV KIT
5.0000 | PACK | Freq: Once | INTRAVENOUS | Status: AC | PRN
  Administered 2023-04-03: 5 via INTRAVENOUS

## 2023-04-03 NOTE — Telephone Encounter (Signed)
 Last OV: 03/13/23  Next OV: none  Last FIlled: 06/03/22  Quantity: 30 w/ 2 refills

## 2023-04-04 ENCOUNTER — Encounter: Payer: Self-pay | Admitting: Physician Assistant

## 2023-04-07 NOTE — Telephone Encounter (Signed)
 Please see pt response as an update

## 2023-08-15 DIAGNOSIS — R1011 Right upper quadrant pain: Secondary | ICD-10-CM | POA: Diagnosis not present

## 2023-08-15 DIAGNOSIS — R7401 Elevation of levels of liver transaminase levels: Secondary | ICD-10-CM | POA: Diagnosis not present

## 2023-08-15 DIAGNOSIS — R7989 Other specified abnormal findings of blood chemistry: Secondary | ICD-10-CM | POA: Diagnosis not present

## 2023-08-15 DIAGNOSIS — R945 Abnormal results of liver function studies: Secondary | ICD-10-CM | POA: Diagnosis not present

## 2023-08-15 DIAGNOSIS — K219 Gastro-esophageal reflux disease without esophagitis: Secondary | ICD-10-CM | POA: Diagnosis not present

## 2023-08-21 ENCOUNTER — Ambulatory Visit: Payer: Self-pay | Admitting: Surgery

## 2023-08-21 DIAGNOSIS — K828 Other specified diseases of gallbladder: Secondary | ICD-10-CM | POA: Diagnosis not present

## 2023-08-21 DIAGNOSIS — K589 Irritable bowel syndrome without diarrhea: Secondary | ICD-10-CM | POA: Diagnosis not present

## 2023-08-30 ENCOUNTER — Other Ambulatory Visit: Payer: Self-pay | Admitting: Physician Assistant

## 2023-08-30 DIAGNOSIS — F419 Anxiety disorder, unspecified: Secondary | ICD-10-CM

## 2023-08-30 NOTE — Telephone Encounter (Signed)
 Last OV: 03/13/23  Next OV: none  Last Filled: 04/03/23  Quantity: 30 w/ 1 refill

## 2023-10-09 NOTE — Patient Instructions (Signed)
 SURGICAL WAITING ROOM VISITATION  Patients having surgery or a procedure may have no more than 2 support people in the waiting area - these visitors may rotate.    Children under the age of 74 must have an adult with them who is not the patient.  Visitors with respiratory illnesses are discouraged from visiting and should remain at home.  If the patient needs to stay at the hospital during part of their recovery, the visitor guidelines for inpatient rooms apply. Pre-op nurse will coordinate an appropriate time for 1 support person to accompany patient in pre-op.  This support person may not rotate.    Please refer to the [facility] website for the visitor guidelines for Inpatients (after your surgery is over and you are in a regular room). =   Your procedure is scheduled on: 10/19/23   Report to [facility] Main [facility]trance    Report to admitting at 5:15 AM   Call this number if you have problems the morning of surgery [phone]   Do not eat food :After Midnight.   After Midnight you may have the following liquids until 4:30 AM DAY OF SURGERY  Water Non-Citrus Juices (without pulp, NO RED-Apple, White grape, White cranberry) Black Coffee (NO MILK/CREAM OR CREAMERS, sugar ok)  Clear Tea (NO MILK/CREAM OR CREAMERS, sugar ok) regular and decaf                             Plain Jell-O (NO RED)                                           Fruit ices (not with fruit pulp, NO RED)                                     Popsicles (NO RED)                                                               Sports drinks like Gatorade (NO RED)                  The day of surgery:  Drink ONE (1) Pre-Surgery Clear Ensure at 4:30 AM the morning of surgery. Drink in one sitting. Do not sip.  This drink was given to you during your hospital  pre-op appointment visit. Nothing else to drink after completing the  Pre-Surgery Clear Ensure          If you have questions, please contact your  surgeon's office.   FOLLOW BOWEL PREP AND ANY ADDITIONAL PRE OP INSTRUCTIONS YOU RECEIVED FROM YOUR SURGEON'S OFFICE!!!     Oral Hygiene is also important to reduce your risk of infection.                                    Remember - BRUSH YOUR TEETH THE MORNING OF SURGERY WITH YOUR REGULAR TOOTHPASTE  DENTURES WILL BE REMOVED PRIOR TO SURGERY PLEASE DO NOT APPLY Poly grip  OR ADHESIVES!!!   Stop all vitamins and herbal supplements 7 days before surgery.   Take these medicines the morning of surgery with A SIP OF WATER: Clonazepam , Pantoprazole                               You may not have any metal on your body including jewelry, and body piercing             Do not wear lotions, powders, cologne, or deodorant              Men may shave face and neck.   Do not bring valuables to the hospital. [facility] IS NOT             RESPONSIBLE   FOR VALUABLES.   Contacts, glasses, dentures or bridgework may not be worn into surgery.  DO NOT BRING YOUR HOME MEDICATIONS TO THE HOSPITAL. PHARMACY WILL DISPENSE MEDICATIONS LISTED ON YOUR MEDICATION LIST TO YOU DURING YOUR ADMISSION IN THE HOSPITAL!    Patients discharged on the day of surgery will not be allowed to drive home.  Someone NEEDS to stay with you for the first 24 hours after anesthesia.              Please read over the following fact sheets you were given: IF YOU HAVE QUESTIONS ABOUT YOUR PRE-OP INSTRUCTIONS PLEASE CALL [phone].   If you received a COVID test during your pre-op visit  it is requested that you wear a mask when out in public, stay away from anyone that may not be feeling well and notify your surgeon if you develop symptoms. If you test positive for Covid or have been in contact with anyone that has tested positive in the last 10 days please notify you surgeon.    [facility] - Preparing for Surgery Before surgery, you can play an important role.  Because skin is not sterile, your skin needs to be  as free of germs as possible.  You can reduce the number of germs on your skin by washing with CHG (chlorahexidine gluconate) soap before surgery.  CHG is an antiseptic cleaner which kills germs and bonds with the skin to continue killing germs even after washing. Please DO NOT use if you have an allergy to CHG or antibacterial soaps.  If your skin becomes reddened/irritated stop using the CHG and inform your nurse when you arrive at Short Stay. Do not shave (including legs and underarms) for at least 48 hours prior to the first CHG shower.  You may shave your face/neck.  Please follow these instructions carefully:  1.  Shower with CHG Soap the night before surgery and the  morning of surgery.  2.  If you choose to wash your hair, wash your hair first as usual with your normal  shampoo.  3.  After you shampoo, rinse your hair and body thoroughly to remove the shampoo.                             4.  Use CHG as you would any other liquid soap.  You can apply chg directly to the skin and wash.  Gently with a scrungie or clean washcloth.  5.  Apply the CHG Soap to your body ONLY FROM THE NECK DOWN.   Do   not use on face/ open  Wound or open sores. Avoid contact with eyes, ears mouth and   genitals (private parts).                       Wash face,  Genitals (private parts) with your normal soap.             6.  Wash thoroughly, paying special attention to the area where your    surgery  will be performed.  7.  Thoroughly rinse your body with warm water from the neck down.  8.  DO NOT shower/wash with your normal soap after using and rinsing off the CHG Soap.                9.  Pat yourself dry with a clean towel.            10.  Wear clean pajamas.            11.  Place clean sheets on your bed the night of your first shower and do not  sleep with pets. Day of Surgery : Do not apply any lotions/deodorants the morning of surgery.  Please wear clean clothes to the  hospital/surgery center.  FAILURE TO FOLLOW THESE INSTRUCTIONS MAY RESULT IN THE CANCELLATION OF YOUR SURGERY  PATIENT SIGNATURE_________________________________  NURSE SIGNATURE__________________________________  ________________________________________________________________________

## 2023-10-09 NOTE — Progress Notes (Signed)
 COVID Vaccine Completed: yes  Date of COVID positive in last 90 days:  PCP - Alyssa Allwardt, PA Cardiologist - n/a  Chest x-ray - N/A EKG - 10/10/23 Epic/chart Stress Test - N/A ECHO - N/A Cardiac Cath - n/a Pacemaker/ICD device last checked:N/A Spinal Cord Stimulator:N/A  Bowel Prep - N/A  Sleep Study - N/A CPAP -   Fasting Blood Sugar - N/A Checks Blood Sugar _____ times a day  Last dose of GLP1 agonist-  N/A GLP1 instructions:  Do not take after     Last dose of SGLT-2 inhibitors-  N/A SGLT-2 instructions:  Do not take after     Blood Thinner Instructions: N/A Last dose:   Time: Aspirin Instructions:N/A Last Dose:  Activity level: Can go up a flight of stairs and perform activities of daily living without stopping and without symptoms of chest pain or shortness of breath.    Anesthesia review: BP 145/100 and 137/104 at PAT. Patient denies hx or current symptoms. He is feeling nervous about surgery and had 2 cups of coffee this morning. Will obtain EKG and BMP. Instructed to check at home and reach out to PCP if still elevated.  Patient denies shortness of breath, fever, cough and chest pain at PAT appointment  Patient verbalized understanding of instructions that were given to them at the PAT appointment. Patient was also instructed that they will need to review over the PAT instructions again at home before surgery.

## 2023-10-10 ENCOUNTER — Other Ambulatory Visit: Payer: Self-pay

## 2023-10-10 ENCOUNTER — Encounter (HOSPITAL_COMMUNITY): Payer: Self-pay

## 2023-10-10 ENCOUNTER — Encounter (HOSPITAL_COMMUNITY)
Admission: RE | Admit: 2023-10-10 | Discharge: 2023-10-10 | Disposition: A | Discharge status: Home / Self Care | Source: Ambulatory Visit | Attending: Surgery | Admitting: Surgery

## 2023-10-10 VITALS — BP 145/100 | HR 94 | Temp 97.8°F | Resp 16 | Ht 68.0 in | Wt 157.0 lb

## 2023-10-10 DIAGNOSIS — Z01818 Encounter for other preprocedural examination: Secondary | ICD-10-CM | POA: Insufficient documentation

## 2023-10-10 HISTORY — DX: Depression, unspecified: F32.A

## 2023-10-10 HISTORY — DX: Family history of other specified conditions: Z84.89

## 2023-10-10 LAB — CBC
HCT: 47.7 % (ref 39.0–52.0)
Hemoglobin: 15.9 g/dL (ref 13.0–17.0)
MCH: 30.9 pg (ref 26.0–34.0)
MCHC: 33.3 g/dL (ref 30.0–36.0)
MCV: 92.6 fL (ref 80.0–100.0)
Platelets: 351 K/uL (ref 150–400)
RBC: 5.15 MIL/uL (ref 4.22–5.81)
RDW: 12.7 % (ref 11.5–15.5)
WBC: 5.6 K/uL (ref 4.0–10.5)
nRBC: 0 % (ref 0.0–0.2)

## 2023-10-10 LAB — BASIC METABOLIC PANEL WITH GFR
Anion gap: 12 (ref 5–15)
BUN: 17 mg/dL (ref 6–20)
CO2: 25 mmol/L (ref 22–32)
Calcium: 9.5 mg/dL (ref 8.9–10.3)
Chloride: 102 mmol/L (ref 98–111)
Creatinine, Ser: 1.04 mg/dL (ref 0.61–1.24)
GFR, Estimated: >60 mL/min (ref >60)
Glucose, Bld: 91 mg/dL (ref 70–99)
Potassium: 3.9 mmol/L (ref 3.5–5.1)
Sodium: 139 mmol/L (ref 135–145)

## 2023-10-19 ENCOUNTER — Other Ambulatory Visit: Payer: Self-pay

## 2023-10-19 ENCOUNTER — Ambulatory Visit (HOSPITAL_COMMUNITY)

## 2023-10-19 ENCOUNTER — Ambulatory Visit (HOSPITAL_COMMUNITY): Payer: Self-pay | Admitting: Medical

## 2023-10-19 ENCOUNTER — Ambulatory Visit (HOSPITAL_COMMUNITY)
Admission: RE | Admit: 2023-10-19 | Discharge: 2023-10-19 | Disposition: A | Discharge status: Home / Self Care | Attending: Surgery | Admitting: Surgery

## 2023-10-19 ENCOUNTER — Encounter (HOSPITAL_COMMUNITY)
Admission: RE | Disposition: A | Discharge status: Home / Self Care | Payer: Self-pay | Source: Home / Self Care | Attending: Surgery

## 2023-10-19 ENCOUNTER — Encounter (HOSPITAL_COMMUNITY): Payer: Self-pay | Admitting: Surgery

## 2023-10-19 ENCOUNTER — Ambulatory Visit (HOSPITAL_COMMUNITY): Admitting: Anesthesiology

## 2023-10-19 DIAGNOSIS — K811 Chronic cholecystitis: Secondary | ICD-10-CM | POA: Diagnosis not present

## 2023-10-19 DIAGNOSIS — K8044 Calculus of bile duct with chronic cholecystitis without obstruction: Secondary | ICD-10-CM | POA: Insufficient documentation

## 2023-10-19 DIAGNOSIS — K219 Gastro-esophageal reflux disease without esophagitis: Secondary | ICD-10-CM | POA: Insufficient documentation

## 2023-10-19 DIAGNOSIS — K7581 Nonalcoholic steatohepatitis (NASH): Secondary | ICD-10-CM | POA: Insufficient documentation

## 2023-10-19 DIAGNOSIS — K828 Other specified diseases of gallbladder: Secondary | ICD-10-CM | POA: Diagnosis not present

## 2023-10-19 DIAGNOSIS — K5732 Diverticulitis of large intestine without perforation or abscess without bleeding: Secondary | ICD-10-CM | POA: Insufficient documentation

## 2023-10-19 DIAGNOSIS — K660 Peritoneal adhesions (postprocedural) (postinfection): Secondary | ICD-10-CM | POA: Insufficient documentation

## 2023-10-19 DIAGNOSIS — K760 Fatty (change of) liver, not elsewhere classified: Secondary | ICD-10-CM | POA: Insufficient documentation

## 2023-10-19 DIAGNOSIS — Z0189 Encounter for other specified special examinations: Secondary | ICD-10-CM | POA: Diagnosis not present

## 2023-10-19 HISTORY — DX: Chronic cholecystitis: K81.1

## 2023-10-19 HISTORY — PX: CHOLECYSTECTOMY: SHX55

## 2023-10-19 HISTORY — PX: LIVER BIOPSY: SHX301

## 2023-10-19 SURGERY — LAPAROSCOPIC CHOLECYSTECTOMY WITH INTRAOPERATIVE CHOLANGIOGRAM
Anesthesia: General | Site: Abdomen

## 2023-10-19 MED ORDER — PROPOFOL 10 MG/ML IV BOLUS
INTRAVENOUS | Status: DC | PRN
Start: 2023-10-19 — End: 2023-10-19
  Administered 2023-10-19: 160 mg via INTRAVENOUS

## 2023-10-19 MED ORDER — PROPOFOL 10 MG/ML IV BOLUS
INTRAVENOUS | Status: AC
Start: 2023-10-19 — End: 2023-10-19
  Filled 2023-10-19: qty 20

## 2023-10-19 MED ORDER — OXYCODONE HCL 5 MG PO TABS
5.0000 mg | ORAL_TABLET | Freq: Once | ORAL | Status: AC | PRN
  Administered 2023-10-19: 5 mg via ORAL

## 2023-10-19 MED ORDER — IOHEXOL 300 MG/ML  SOLN
INTRAMUSCULAR | Status: DC | PRN
  Administered 2023-10-19: 10 mL

## 2023-10-19 MED ORDER — ONDANSETRON HCL 4 MG/2ML IJ SOLN
INTRAMUSCULAR | Status: AC
Start: 2023-10-19 — End: 2023-10-19
  Filled 2023-10-19: qty 2

## 2023-10-19 MED ORDER — LIDOCAINE HCL (PF) 2 % IJ SOLN
INTRAMUSCULAR | Status: AC
Start: 2023-10-19 — End: 2023-10-19
  Filled 2023-10-19: qty 5

## 2023-10-19 MED ORDER — SUGAMMADEX SODIUM 200 MG/2ML IV SOLN
INTRAVENOUS | Status: DC | PRN
  Administered 2023-10-19: 200 mg via INTRAVENOUS

## 2023-10-19 MED ORDER — BUPIVACAINE LIPOSOME 1.3 % IJ SUSP
INTRAMUSCULAR | Status: AC
Start: 2023-10-19 — End: 2023-10-19
  Filled 2023-10-19: qty 20

## 2023-10-19 MED ORDER — CHLORHEXIDINE GLUCONATE CLOTH 2 % EX PADS: 6.0000 | MEDICATED_PAD | Freq: Once | CUTANEOUS | Status: DC

## 2023-10-19 MED ORDER — MIDAZOLAM HCL 2 MG/2ML IJ SOLN
INTRAMUSCULAR | Status: DC | PRN
  Administered 2023-10-19: 2 mg via INTRAVENOUS

## 2023-10-19 MED ORDER — BUPIVACAINE-EPINEPHRINE (PF) 0.25% -1:200000 IJ SOLN
INTRAMUSCULAR | Status: AC
Start: 2023-10-19 — End: 2023-10-19
  Filled 2023-10-19: qty 30

## 2023-10-19 MED ORDER — TRAMADOL HCL 50 MG PO TABS: 50.0000 mg | ORAL_TABLET | Freq: Four times a day (QID) | ORAL | 0 refills | Status: AC | PRN

## 2023-10-19 MED ORDER — PHENYLEPHRINE HCL (PRESSORS) 10 MG/ML IV SOLN
INTRAVENOUS | Status: DC | PRN
  Administered 2023-10-19: 160 ug via INTRAVENOUS

## 2023-10-19 MED ORDER — FENTANYL CITRATE PF 50 MCG/ML IJ SOSY
PREFILLED_SYRINGE | INTRAMUSCULAR | Status: AC
  Filled 2023-10-19: qty 1

## 2023-10-19 MED ORDER — CELECOXIB 200 MG PO CAPS
200.0000 mg | ORAL_CAPSULE | ORAL | Status: AC
Start: 2023-10-19 — End: 2023-10-19
  Administered 2023-10-19: 200 mg via ORAL
  Filled 2023-10-19: qty 1

## 2023-10-19 MED ORDER — ONDANSETRON HCL 4 MG/2ML IJ SOLN
INTRAMUSCULAR | Status: DC | PRN
  Administered 2023-10-19: 4 mg via INTRAVENOUS

## 2023-10-19 MED ORDER — OXYCODONE HCL 5 MG PO TABS
ORAL_TABLET | ORAL | Status: AC
  Filled 2023-10-19: qty 1

## 2023-10-19 MED ORDER — FENTANYL CITRATE (PF) 250 MCG/5ML IJ SOLN
INTRAMUSCULAR | Status: DC | PRN
  Administered 2023-10-19 (×3): 50 ug via INTRAVENOUS
  Administered 2023-10-19: 100 ug via INTRAVENOUS

## 2023-10-19 MED ORDER — ENSURE PRE-SURGERY PO LIQD
296.0000 mL | Freq: Once | ORAL | Status: DC
  Filled 2023-10-19: qty 296

## 2023-10-19 MED ORDER — LIDOCAINE 2% (20 MG/ML) 5 ML SYRINGE
INTRAMUSCULAR | Status: DC | PRN
  Administered 2023-10-19: 60 mg via INTRAVENOUS

## 2023-10-19 MED ORDER — ROCURONIUM BROMIDE 10 MG/ML (PF) SYRINGE
PREFILLED_SYRINGE | INTRAVENOUS | Status: AC
Start: 2023-10-19 — End: 2023-10-19
  Filled 2023-10-19: qty 10

## 2023-10-19 MED ORDER — MIDAZOLAM HCL 2 MG/2ML IJ SOLN
INTRAMUSCULAR | Status: AC
  Filled 2023-10-19: qty 2

## 2023-10-19 MED ORDER — LACTATED RINGERS IV SOLN: INTRAVENOUS | Status: DC

## 2023-10-19 MED ORDER — FENTANYL CITRATE PF 50 MCG/ML IJ SOSY
PREFILLED_SYRINGE | INTRAMUSCULAR | Status: AC
  Filled 2023-10-19: qty 2

## 2023-10-19 MED ORDER — FENTANYL CITRATE (PF) 250 MCG/5ML IJ SOLN
INTRAMUSCULAR | Status: AC
  Filled 2023-10-19: qty 5

## 2023-10-19 MED ORDER — ORAL CARE MOUTH RINSE: 15.0000 mL | Freq: Once | OROMUCOSAL | Status: AC

## 2023-10-19 MED ORDER — ACETAMINOPHEN 500 MG PO TABS
1000.0000 mg | ORAL_TABLET | ORAL | Status: AC
Start: 2023-10-19 — End: 2023-10-19
  Administered 2023-10-19: 1000 mg via ORAL
  Filled 2023-10-19: qty 2

## 2023-10-19 MED ORDER — LACTATED RINGERS IR SOLN
Status: DC | PRN
  Administered 2023-10-19: 1000 mL

## 2023-10-19 MED ORDER — ROCURONIUM 10MG/ML (10ML) SYRINGE FOR MEDFUSION PUMP - OPTIME
INTRAVENOUS | Status: DC | PRN
  Administered 2023-10-19: 50 mg via INTRAVENOUS
  Administered 2023-10-19: 10 mg via INTRAVENOUS

## 2023-10-19 MED ORDER — FENTANYL CITRATE PF 50 MCG/ML IJ SOSY
25.0000 ug | PREFILLED_SYRINGE | INTRAMUSCULAR | Status: DC | PRN
  Administered 2023-10-19 (×3): 50 ug via INTRAVENOUS

## 2023-10-19 MED ORDER — OXYCODONE HCL 5 MG/5ML PO SOLN: 5.0000 mg | Freq: Once | ORAL | Status: AC | PRN

## 2023-10-19 MED ORDER — EPHEDRINE SULFATE (PRESSORS) 50 MG/ML IJ SOLN
INTRAMUSCULAR | Status: DC | PRN
  Administered 2023-10-19: 10 mg via INTRAVENOUS

## 2023-10-19 MED ORDER — DEXMEDETOMIDINE HCL IN NACL 80 MCG/20ML IV SOLN
INTRAVENOUS | Status: DC | PRN
  Administered 2023-10-19: 10 ug via INTRAVENOUS

## 2023-10-19 MED ORDER — CHLORHEXIDINE GLUCONATE 0.12 % MT SOLN
15.0000 mL | Freq: Once | OROMUCOSAL | Status: AC
  Administered 2023-10-19: 15 mL via OROMUCOSAL

## 2023-10-19 MED ORDER — AMISULPRIDE (ANTIEMETIC) 5 MG/2ML IV SOLN: 10.0000 mg | Freq: Once | INTRAVENOUS | Status: DC | PRN

## 2023-10-19 MED ORDER — SODIUM CHLORIDE 0.9 % IR SOLN
Status: DC | PRN
  Administered 2023-10-19: 1000 mL

## 2023-10-19 MED ORDER — BUPIVACAINE-EPINEPHRINE 0.25% -1:200000 IJ SOLN
INTRAMUSCULAR | Status: DC | PRN
  Administered 2023-10-19: 30 mL

## 2023-10-19 MED ORDER — ONDANSETRON HCL 4 MG PO TABS: 4.0000 mg | ORAL_TABLET | Freq: Three times a day (TID) | ORAL | 5 refills | Status: AC | PRN

## 2023-10-19 MED ORDER — CEFAZOLIN SODIUM 1 G IJ SOLR
INTRAMUSCULAR | Status: AC
  Filled 2023-10-19: qty 20

## 2023-10-19 MED ORDER — GABAPENTIN 300 MG PO CAPS
300.0000 mg | ORAL_CAPSULE | ORAL | Status: AC
  Administered 2023-10-19: 300 mg via ORAL
  Filled 2023-10-19: qty 1

## 2023-10-19 SURGICAL SUPPLY — 42 items
BAG COUNTER SPONGE SURGICOUNT (BAG) ×2 IMPLANT
CABLE HIGH FREQUENCY MONO STRZ (ELECTRODE) IMPLANT
CLIP APPLIE 5 13 M/L LIGAMAX5 (MISCELLANEOUS) IMPLANT
CLIP APPLIE ROT 10 11.4 M/L (STAPLE) IMPLANT
COVER MAYO STAND XLG (MISCELLANEOUS) ×2 IMPLANT
COVER SURGICAL LIGHT HANDLE (MISCELLANEOUS) ×2 IMPLANT
DRAPE 3/4 80X56 (DRAPES) IMPLANT
DRAPE C-ARM 42X120 X-RAY (DRAPES) ×2 IMPLANT
DRAPE UTILITY XL STRL (DRAPES) ×2 IMPLANT
DRAPE WARM FLUID 44X44 (DRAPES) ×2 IMPLANT
DRSG OPSITE POSTOP 4X8 (GAUZE/BANDAGES/DRESSINGS) IMPLANT
DRSG TEGADERM 2-3/8X2-3/4 SM (GAUZE/BANDAGES/DRESSINGS) ×4 IMPLANT
DRSG TEGADERM 4X4.75 (GAUZE/BANDAGES/DRESSINGS) IMPLANT
DRSG TEGADERM 6X8 (GAUZE/BANDAGES/DRESSINGS) ×2 IMPLANT
ELECT REM PT RETURN 15FT ADLT (MISCELLANEOUS) ×2 IMPLANT
ENDOLOOP SUT PDS II 0 18 (SUTURE) IMPLANT
GAUZE SPONGE 2X2 8PLY STRL LF (GAUZE/BANDAGES/DRESSINGS) IMPLANT
GLOVE ECLIPSE 8.0 STRL XLNG CF (GLOVE) ×2 IMPLANT
GLOVE INDICATOR 8.0 STRL GRN (GLOVE) ×2 IMPLANT
GOWN STRL REUS W/ TWL XL LVL3 (GOWN DISPOSABLE) ×2 IMPLANT
IRRIGATION SUCT STRKRFLW 2 WTP (MISCELLANEOUS) ×2 IMPLANT
KIT BASIN OR (CUSTOM PROCEDURE TRAY) ×2 IMPLANT
KIT TURNOVER KIT A (KITS) ×2 IMPLANT
NDL BIOPSY 14X6 SOFT TISS (NEEDLE) IMPLANT
NEEDLE BIOPSY 14X6 SOFT TISS (NEEDLE) ×2 IMPLANT
PAD TELFA 2X3 NADH STRL (GAUZE/BANDAGES/DRESSINGS) IMPLANT
PENCIL SMOKE EVACUATOR (MISCELLANEOUS) IMPLANT
POUCH RETRIEVAL ECOSAC 10 (ENDOMECHANICALS) ×2 IMPLANT
SCISSORS LAP 5X35 DISP (ENDOMECHANICALS) ×2 IMPLANT
SET CHOLANGIOGRAPH MIX (MISCELLANEOUS) ×2 IMPLANT
SET TUBE SMOKE EVAC HIGH FLOW (TUBING) ×2 IMPLANT
SLEEVE ADV FIXATION 5X100MM (TROCAR) ×2 IMPLANT
SPIKE FLUID TRANSFER (MISCELLANEOUS) ×2 IMPLANT
SUT MNCRL AB 4-0 PS2 18 (SUTURE) ×2 IMPLANT
SUT PDS AB 1 CT 36 (SUTURE) ×2 IMPLANT
SUT VICRYL 0 UR6 27IN ABS (SUTURE) ×2 IMPLANT
SYR 20ML LL LF (SYRINGE) ×2 IMPLANT
TOWEL OR 17X26 10 PK STRL BLUE (TOWEL DISPOSABLE) ×2 IMPLANT
TRAY LAPAROSCOPIC (CUSTOM PROCEDURE TRAY) ×2 IMPLANT
TROCAR ADV FIXATION 12X100MM (TROCAR) ×2 IMPLANT
TROCAR ADV FIXATION 5X100MM (TROCAR) ×2 IMPLANT
TROCAR Z-THREAD OPTICAL 5X100M (TROCAR) ×2 IMPLANT

## 2023-10-19 NOTE — Anesthesia Procedure Notes (Signed)
 Procedure Name: Intubation Date/Time: 10/19/2023 7:39 AM  Performed by: Nanci Riis, CRNAPre-anesthesia Checklist: Patient identified, Emergency Drugs available, Suction available, Patient being monitored and Timeout performed Patient Re-evaluated:Patient Re-evaluated prior to induction Oxygen Delivery Method: Circle system utilized Preoxygenation: Pre-oxygenation with 100% oxygen Induction Type: IV induction Ventilation: Mask ventilation without difficulty Laryngoscope Size: Miller and 3 Grade View: Grade II Tube type: Oral Number of attempts: 1 Airway Equipment and Method: Stylet Placement Confirmation: ETT inserted through vocal cords under direct vision, positive ETCO2 and breath sounds checked- equal and bilateral Secured at: 22 cm Dental Injury: Teeth and Oropharynx as per pre-operative assessment

## 2023-10-19 NOTE — Discharge Instructions (Signed)
 ################################################################  LAPAROSCOPIC SURGERY: POST OP INSTRUCTIONS  ######################################################################  EAT Gradually transition to a high fiber diet with a fiber supplement over the next few weeks after discharge.  Start with a pureed / full liquid diet (see below)  WALK Walk an hour a day.  Control your pain to do that.    CONTROL PAIN Control pain so that you can walk, sleep, tolerate sneezing/coughing, go up/down stairs.  HAVE A BOWEL MOVEMENT DAILY Keep your bowels regular to avoid problems.  OK to try a laxative to override constipation.  OK to use an antidairrheal to slow down diarrhea.  Call if not better after 2 tries  CALL IF YOU HAVE PROBLEMS/CONCERNS Call if you are still struggling despite following these instructions. Call if you have concerns not answered by these instructions  ######################################################################    DIET: Follow a light bland diet & liquids the first 24 hours after arrival home, such as soup, liquids, starches, etc.  Be sure to drink plenty of fluids.  Quickly advance to a usual solid diet within a few days.  Avoid fast food or heavy meals as your are more likely to get nauseated or have irregular bowels.  A low-fat, high-fiber diet for the rest of your life is ideal.  Take your usually prescribed home medications unless otherwise directed. Blood thinners:  You can restart any strong blood thinners after the second postoperative day  for example: COUMADIN (warfarin), XERELTO (rivaroxaban), ELIQUIS (apixaban), PLAVIX (clopidigrel), BRILINTA (ticagrelor), EFFIENT (prasugrel), PRADAXA (dabigatran), etc  Continue aspirin before & after surgery..     Some oozing/bleeding the first 1-2 weeks is common but should taper down & be small volume.    If you are passing many large clots or having uncontrolling bleeding, call your surgeon  PAIN  CONTROL: Pain is best controlled by a usual combination of three different methods TOGETHER: Ice/Heat Over the counter pain medication Prescription pain medication Most patients will experience some swelling and bruising around the incisions.  Ice packs or heating pads (30-60 minutes up to 6 times a day) will help. Use ice for the first few days to help decrease swelling and bruising, then switch to heat to help relax tight/sore spots and speed recovery.  Some people prefer to use ice alone, heat alone, alternating between ice & heat.  Experiment to what works for you.  Swelling and bruising can take several weeks to resolve.   It is helpful to take an over-the-counter pain medication regularly for the first few weeks.  Choose one of the following that works best for you: Naproxen  (Aleve , etc)  Two 220mg  tabs twice a day Ibuprofen (Advil, etc) Three 200mg  tabs four times a day (every meal & bedtime) Acetaminophen  (Tylenol , etc) 500-650mg  four times a day (every meal & bedtime) A  prescription for pain medication (such as oxycodone , hydrocodone, tramadol , gabapentin , methocarbamol, etc) should be given to you upon discharge.  Take your pain medication as prescribed.  If you are having problems/concerns with the prescription medicine (does not control pain, nausea, vomiting, rash, itching, etc), please call us  [phone] to see if we need to switch you to a different pain medicine that will work better for you and/or control your side effect better. If you need a refill on your pain medication, please give us  48 hour notice.  contact your pharmacy.  They will contact our office to request authorization. Prescriptions will not be filled after 5 pm or on week-ends  AVOID GETTING CONSTIPATED.   a.  Between the surgery and the pain medications, it is common to experience some constipation.  b.  Drink plenty of liquids c   ake a fiber supplement 2 times day (such as Metamucil, Citrucel, FiberCon,  MiraLax, etc) to have a bowel movement every day. d.  If you have not had a BM by 2 days after surgery: -drink liquids only until you have a bowel movement - take MiraLAX 2 doses every 2 hours until you have a bowel movement   Watch out for diarrhea.   If you have many loose bowel movements, simplify your diet to bland foods & liquids for a few days.   Stop any stool softeners and decrease your fiber supplement.   Switching to mild anti-diarrheal medications (Kayopectate, Pepto Bismol) can help.   If this worsens or does not improve, please call us .  Wash / shower every day.  You may shower over the dressings as they are waterproof.  Continue to shower over incision(s) after the dressing is off.  It is good for closed incisions and even open wounds to be washed every day.  Shower every day.  Short baths are fine.  Wash the incisions and wounds clean with soap & water.    You may leave closed incisions open to air if it is dry.   You may cover the incision with clean gauze & replace it after your daily shower for comfort.  TEGADERM:  You have clear gauze band-aid dressings over your closed incision(s).  Remove the dressings 2 days after surgery = 9/20 Saturday.    ACTIVITIES as tolerated:   You may resume regular (light) daily activities beginning the next day--such as daily self-care, walking, climbing stairs--gradually increasing activities as tolerated.  If you can walk 30 minutes without difficulty, it is safe to try more intense activity such as jogging, treadmill, bicycling, low-impact aerobics, swimming, etc. Save the most intensive and strenuous activity for last such as sit-ups, heavy lifting, contact sports, etc  Refrain from any heavy lifting or straining until you are off narcotics for pain control.   DO NOT PUSH THROUGH PAIN.  Let pain be your guide: If it hurts to do something, don't do it.  Pain is your body warning you to avoid that activity for another week until the pain goes  down. You may drive when you are no longer taking prescription pain medication, you can comfortably wear a seatbelt, and you can safely maneuver your car and apply brakes. You may have sexual intercourse when it is comfortable.  FOLLOW UP in our office Please call [facility] at [phone] to set up an appointment to see your surgeon in the office for a follow-up appointment approximately 2-3 weeks after your surgery. Make sure that you call for this appointment the day you arrive home to insure a convenient appointment time.  10. IF YOU HAVE DISABILITY OR FAMILY LEAVE FORMS, BRING THEM TO THE OFFICE FOR PROCESSING.  DO NOT GIVE THEM TO YOUR DOCTOR.   WHEN TO CALL US  [phone]: Poor pain control Reactions / problems with new medications (rash/itching, nausea, etc)  Fever over 101.5 F (38.5 C) Inability to urinate Nausea and/or vomiting Worsening swelling or bruising Continued bleeding from incision. Increased pain, redness, or drainage from the incision   The clinic staff is available to answer your questions during regular business hours (8:30am-5pm).  Please don't hesitate to call and ask to speak to one of our nurses for clinical concerns.   If you have  a medical emergency, go to the nearest emergency room or call 911.  A surgeon from [facility] is always on call at the [facility], [place] [address], [place], [place]  [address] ? MAIN: [phone] ? TOLL FREE: [phone] ?  FAX [phone] [email or web address]  ##############################################################

## 2023-10-19 NOTE — Op Note (Signed)
 10/19/2023  PATIENT:  Edward Carpenter Pin  53 y.o. male  Patient Care Team: Allwardt, Mardy HERO, PA-C as PCP - General (Physician Assistant) Sheldon Standing, MD as Consulting Physician (General Surgery) Elicia Claw, MD as Consulting Physician (Gastroenterology)  PRE-OPERATIVE DIAGNOSIS:    Chronic Cholecystitis  POST-OPERATIVE DIAGNOSIS:   Chronic Cholecystitis  PROCEDURE:  Core Liver Biopsy (CPT code 52998) & SINGLE SITE Laparoscopic cholecystectomy with intraoperative cholangiogram (CPT code 52436)  SURGEON:  Standing KYM Sheldon, MD, FACS.  ASSISTANT:  (n/a)   ANESTHESIA:  General endotracheal intubation anesthesia (GETA) and Local & regional field block at incision(s) for perioperative & postoperative pain control provided with 30mL of bupivicaine 0.25% with epinephrine   Estimated Blood Loss (EBL):   Total I/O In: -  Out: 10 [Blood:10].   (See anesthesia record)  Delay start of Pharmacological VTE agent (>24hrs) due to concerns of significant anemia, surgical blood loss, or risk of bleeding?:  no  DRAINS: (None)  SPECIMEN:  Gallbladder and Core needle liver biopsies  DISPOSITION OF SPECIMEN:  Pathology  COUNTS:  Sponge, needle, & instrument counts CORRECT at the conclusion of the case.      PLAN OF CARE: Discharge to home after PACU  PATIENT DISPOSITION:  PACU - hemodynamically stable.  INDICATION: Healthy active male with intermittent right upper quadrant abdominal pain attacks especially with heavy or greasy or foods for the past year.  Seen by primary care Gastroenterology was underwhelming workup.  No gallstones but nuclear medicine HIDA scan shows elevated ejection fraction concerning for dyskinesia.  Given the fact nothing else in the differential diagnosis but with progressive symptoms, I offered cholecystectomy and possible liver biopsy  The anatomy & physiology of hepatobiliary & pancreatic function was discussed.  The pathophysiology of gallbladder dysfunction was  discussed.  Natural history risks without surgery was discussed.   I feel the risks of no intervention will lead to serious problems that outweigh the operative risks; therefore, I recommended cholecystectomy to remove the pathology.  I explained laparoscopic techniques with possible need for an open approach.  Probable cholangiogram to evaluate the bilary tract was explained as well.    Risks such as bleeding, infection, abscess, leak, injury to other organs, need for further treatment, heart attack, death, and other risks were discussed.  I noted a good likelihood this will help address the problem.  Possibility that this will not correct all abdominal symptoms was explained.  Goals of post-operative recovery were discussed as well.  We will work to minimize complications.  An educational handout further explaining the pathology and treatment options was given as well.  Questions were answered.  The patient expresses understanding & wishes to proceed with surgery.  OR FINDINGS:  Gallbladder with some grey changes, moderately dense omental adhesions, and thickening; suspicious for chronic cholecystitis.  Cholangiogram without any evidence of any obstruction or concern.  Rather normal biliary anatomy.  Liver: Mild steatosis.  Core liver biopsies done to rule out steatohepatitis or other concerns.  DESCRIPTION:   The patient was identified & brought in the operating room. The patient was positioned supine with arms tucked. SCDs were active during the entire case. The patient underwent general anesthesia without any difficulty.  The abdomen was prepped and draped in a sterile fashion. A Surgical Timeout confirmed our plan.  I made a transverse curvilinear incision through the superior umbilical fold.  I placed a 5mm long port through the supraumbilical fascia using a modified Hassan cutdown technique with umbilical stalk fascial countertraction. I began  carbon dioxide insufflation.  No change in end tidal  CO2 measurement.   Camera inspection revealed no injury. There were no adhesions to the anterior abdominal wall supraumbilically.  One wispy omental adhesion periumbilically that was able to be cleared off.  No colon or bowel nearby.  I proceeded to continue with single site technique. I placed a #5 port in left upper aspect of the wound. I placed a 5 mm atraumatic grasper in the right inferior aspect of the wound.  I turned attention to the right upper quadrant.  Operative findings as noted above.  I freed greater omentum off the liver edge in the dome of the gallbladder to expose it.  The gallbladder fundus was elevated cephalad. I freed adhesions to the ventral surface of the gallbladder off carefully.  I freed the peritoneal coverings between the gallbladder and the liver on both the posteriolateral and anteriomedial walls. I alternated between Harmonic & blunt [place]  dissection to help get a good critical view of the cystic artery and cystic duct.  I did further dissection to free 80% of the gallbladder off the liver bed to get a good critical view of the infundibulum and cystic duct.  I dissected out the cystic artery; and, after getting a good 360 view, ligated the anterior & posterior branches of the cystic artery close on the infundibulum of the gallbladder using the Harmonic ultrasonic dissection.  I skeletonized the cystic duct and confirmed a good 360 degree critical view.  I placed a clip on the infundibulum. I did a partial cystic duct-otomy and ensured patency.  I placed a 5 [place] cholangiocatheter through a puncture site at the right subcostal ridge of the abdominal wall and directed it into the cystic duct.  We ran a cholangiogram with dilute radio-opaque contrast and continuous fluoroscopy. Contrast flowed from a side branch consistent with cystic duct cannulization. Contrast flowed up the common hepatic duct into the right and left intrahepatic chains out to secondary radicals. Contrast  flowed down the common bile duct easily across the normal ampulla into the duodenum.  No evidence of biliary dilatation, stricture, filling defect, nor contrast extravasation. This was consistent with a normal cholangiogram.  I removed the cholangiocatheter. I placed clips on the viable cystic duct x4 to completely occlude with each clip.  I completed cystic duct transection.  Remaining stump was healthy and viable.  I freed the gallbladder from its remaining attachments to the liver. I ensured hemostasis on the gallbladder fossa of the liver and elsewhere.  I placed the gallbladder inside an EcoSac.    Given concerns about the liver, decided proceed with needle biopsies.  Used a 14-gauge Tru-Cut needle and passed through the anterior liver lobe.  Did 3 passes to get adequate liver core specimens.  I assured hemostasis on the liver bed.  I inspected the rest of the abdomen & detected no injury nor bleeding elsewhere.  I removed the EcoSac containing the gallbladder out of the supraumbilical fascia.  I had to open up the fascia to 22mm to safely get it out.  I closed the fascia transversely using #1 PDS interrupted stitches. I closed the skin using 4-0 monocryl stitch.  Sterile dressing was applied. The patient was extubated & arrived in the PACU in stable condition.  I had discussed postoperative care with the patient in the holding area. I discussed operative findings, updated the patient's status, discussed probable steps to recovery, and gave postoperative recommendations to the patient's spouse, Breslin Hemann.  Recommendations were made.  Questions were answered.  She expressed understanding & appreciation.  Elspeth KYM Schultze, M.D., F.A.C.S. Gastrointestinal and Minimally Invasive Surgery [facility] [address], [address] [place], [place] [address] [phone] Main / Paging  10/19/2023 8:49 AM

## 2023-10-19 NOTE — H&P (Signed)
 10/19/2023.    PATIENT NAME: Edward Carpenter MRN: [id number] DOB: 23-Oct-1970 PHYSICIANS:   REFERRING PHYSICIAN:  Elicia Claw, MD   CARE TEAM:   Patient Care Team: Allwardt, Mardy HERO as PCP - General Elicia Claw, MD as Referring Physician (Gastroenterology)   CONSULTING PROVIDER:  ELSPETH JUDAH SCHULTZE, MD         SUBJECTIVE    Chief Complaint: New Consultation ( GALLBLADDER HYPERKINESIA )     Edward Carpenter is a 53 y.o. male  who is seen today as an office consultation  at the request of DrRONITA Elicia  for evaluation of abnormal gallbladder   History of Present Illness:   Pleasant 53 year old male.  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  Been in the [place]  for quite some time.  Normally rather active and exercises regularly.  Claims to have some occasional ear to bowel syndromes with diarrhea with stress but otherwise rather healthy.  For the past year he has had crampy abdominal pain especially in the right upper quadrant.  Usually triggered with heavy or greasy or foods.  Radiates to the back and sometimes the shoulder.  Some nausea and bloating.  No improvement with over-the-counter meds.  No recent travel or other issues.   He saw primary and gastroenterology.  Hepatitis and pancreas panel underwhelming.  He maybe has a few glasses of wine a week but not heavy drinker.  Usually has some coffee in the morning.  Moves his bowels once a day.  Has had diarrhea issues on PPIs.  Had underwhelming upper and lower endoscopy 5 years ago H. pylori negative.  Denies any heartburn reflux.  No dysphagia to solids or liquids.  No other food intolerances.  Tends not to eat spicy foods.  Does not smoke.  Never any abdominal surgery.  He is try to adjust to have a more bland diet and has had some unintentional weight loss as a result.  Usually the attacks happen at night waking up.  Feeling worse.  Based concerns ultrasounds done which showed no stones.  However nuclear  medicine study did show elevated gallbladder ejection fraction raising concern of hyperkinesis.  Surgical consultation requested.         Medical History:       Past Medical History:  Diagnosis Date   Anxiety     GERD (gastroesophageal reflux disease)           Patient Active Problem List  Diagnosis   Biliary dyskinesia   Irritable bowel syndrome      History reviewed. No pertinent surgical history.        Allergies  Allergen Reactions   Prednisone Other (See Comments)            Current Outpatient Medications on File Prior to Visit  Medication Sig Dispense Refill   clonazePAM  (KLONOPIN ) 0.5 MG tablet TAKE 1/2 TO 1 TABLET BY MOUTH EVERY DAY AT BEDTIME FOR SLEEP AND ANXIETY        No current facility-administered medications on file prior to visit.           Family History  Problem Relation Age of Onset   Stroke Father        Social History        Tobacco Use  Smoking Status Former   Types: Cigarettes  Smokeless Tobacco Never      Social History         Socioeconomic History   Marital status: Married  Tobacco Use   Smoking  status: Former      Types: Cigarettes   Smokeless tobacco: Never  Vaping Use   Vaping status: Never Used  Substance and Sexual Activity   Alcohol use: Yes      Alcohol/week: 0.0 - 1.0 standard drinks of alcohol   Drug use: Never    Social Drivers of Acupuncturist Strain: Medium Risk (03/12/2023)    Received from [facility]    Overall Financial Resource Strain (CARDIA)     Difficulty of Paying Living Expenses: Somewhat hard  Food Insecurity: Food Insecurity Present (03/12/2023)    Received from [facility]    Hunger Vital Sign     Within the past 12 months, you worried that your food would run out before you got the money to buy more.: Sometimes true     Within the past 12 months, the food you bought just didn't last and you didn't have money to get more.: Sometimes true  Transportation Needs: No  Transportation Needs (03/12/2023)    Received from [facility] - Transportation     Lack of Transportation (Medical): No     Lack of Transportation (Non-Medical): No  Physical Activity: Insufficiently Active (03/12/2023)    Received from [facility]    Exercise Vital Sign     On average, how many days per week do you engage in moderate to strenuous exercise (like a brisk walk)?: 1 day     On average, how many minutes do you engage in exercise at this level?: 20 min  Stress: Stress Concern Present (03/12/2023)    Received from [facility] - Occupational Stress Questionnaire     Feeling of Stress : Very much  Social Connections: Unknown (03/12/2023)    Received from [facility]    Social Connection and Isolation Panel     In a typical week, how many times do you talk on the phone with family, friends, or neighbors?: Once a week     How often do you get together with friends or relatives?: Patient declined     How often do you attend church or religious services?: Never     Do you belong to any clubs or organizations such as church groups, unions, fraternal or athletic groups, or school groups?: No     Are you married, widowed, divorced, separated, never married, or living with a partner?: Married  Housing Stability: Unknown (08/21/2023)    Housing Stability Vital Sign     Homeless in the Last Year: No      ############################################################   Review of Systems: A complete review of systems (ROS) was obtained from the patient.   We have reviewed this information and discussed as appropriate with the patient.   See HPI as well for other pertinent ROS.   Constitutional:  No fevers, chills, sweats.  Weight stable Eyes:  No vision changes, No discharge HENT:  No sore throats, nasal drainage Lymph: No neck swelling, No bruising easily Pulmonary:  No cough, productive sputum CV: No orthopnea, PND . No exertional  chest/neck/shoulder/arm pain.  Patient can walk 2-3 miles without difficulty.     GI: No personal nor family history of GI/colon cancer, inflammatory bowel disease, irritable bowel syndrome, allergy such as Celiac Sprue, dietary/dairy problems, colitis, ulcers nor gastritis.  No recent sick contacts/gastroenteritis.  No travel outside the country.  No changes in diet.   Renal: No UTIs,  No hematuria Genital:  No drainage, bleeding, masses Musculoskeletal: No severe joint pain.  Good ROM major joints Skin:  No sores or lesions Heme/Lymph:  No easy bleeding.  No swollen lymph nodes Neuro:  No active seizures.  No facial droop Psych:  No hallucinations.  No agitation   OBJECTIVE        Vitals:    08/21/23 0954 08/21/23 0956  BP: 123/85    Pulse: 109    Temp: 37.1 C (98.7 F)    TempSrc: Temporal    SpO2: 98%    Weight: 72.2 kg (159 lb 3.2 oz)    Height: 173 cm (5' 8.11)    PainSc:     3  PainLoc:   Abdomen    Body mass index is 24.13 kg/m.   PHYSICAL EXAM:     Constitutional: Not cachectic.  Hygeine adequate.  Vitals signs as above.   Eyes: No glasses.  Vision adequate,Pupils reactive, normal extraocular movements. Sclera nonicteric Neuro: CN II-XII intact.  No major focal sensory defects.  No major motor deficits. Lymph: No head/neck/groin lymphadenopathy Psych:  No severe agitation.  No severe anxiety.  Judgment & insight Adequate, Oriented x4, HENT: Normocephalic, Mucus membranes moist.  No thrush.  Hearing: adequate Neck: Supple, No tracheal deviation.  No obvious thyromegaly Chest: No pain to chest wall compression.  Good respiratory excursion.  No audible wheezing CV:  Pulses intact.  regular.  No major extremity edema Ext: No obvious deformity or contracture.  Edema: Not present.  No cyanosis Skin: No major subcutaneous nodules.  Warm and dry Musculoskeletal: Severe joint rigidity not present.  No obvious clubbing.  No digital petechiae.  Mobility: no assist device  moving easily without restrictions   Abdomen:  Flat Soft.   Nondistended.  .  Mild discomfort in right upper quadrant with deeper palpation without a true Murphy sign.  Rest of the abdomen nontender.    Hernia: Not present. Diastasis recti: Not present.  No hepatomegaly.  No splenomegaly.   Genital/Pelvic:  Inguinal hernia: Not present.  Inguinal lymph nodes: without lymphadenopathy nor hidradenitis.     Rectal: (Deferred)           ###################################################################   Labs, Imaging and Diagnostic Testing:   Located in 'Care Everywhere' section of Epic EMR chart     PRIOR [facility] CLINIC NOTES:   Not applicable     SURGERY NOTES:   Not applicable     PATHOLOGY:   Not applicable       Assessment and Plan:  DIAGNOSES:   Diagnoses and all orders for this visit:   Biliary dyskinesia   Irritable bowel syndrome, unspecified type       ASSESSMENT/PLAN   Rather classic story of biliary colic with postprandial right upper quadrant pain rating to back and shoulders with bloating and nausea.  Dwindling tolerance of fats and proteins with much more bland diet and unintentional weight loss.  Extensive workup with underwhelming endoscopy, hepatic workup, colonoscopy, GI workup.   Nuclear medicine HIDA scan does show elevated gallbladder ejection fraction raising concern of hyperkinesis.  No definite stones.   Given the fact he has a rather classic story of biliary colic with an abnormal gallbladder and seen by GI with extensive workup revealing on there and etiologies, it is reasonable to offer cholecystectomy.  Reasonable for a single site laparoscopic approach with low threshold to place for port if needed.  I did caution it may not solve all his problems since it sounds  like he has some IBS issues but he has done his due diligence with workup and other treatments without success and his problems are worsening.  He understands the risk this may not  solve the problem but has a decent chance to do that.  He agrees to proceed with outpatient cholecystectomy  The anatomy & physiology of hepatobiliary & pancreatic function was discussed.  The pathophysiology of gallbladder dysfunction was discussed.  Natural history risks without surgery was discussed.   I feel the risks of no intervention will lead to serious problems that outweigh the operative risks; therefore, I recommended cholecystectomy to remove the pathology.  I explained laparoscopic techniques with possible need for an open approach.  Probable cholangiogram to evaluate the bilary tract was explained as well.  Possible need for a needle core biopsy if liver changes seen or history of abnormal liver labs   Risks such as bleeding, infection, diarrhea and other bowel changes, abscess, leak, injury to other organs, need for repair of tissues / organs, need for further treatment, stroke, heart attack, death, and other risks were discussed.  I noted a good likelihood this will help address the problem, but there is a chance it may not help.  Possibility that this will not correct all abdominal symptoms was explained.  Goals of post-operative recovery were discussed as well.  We will work to minimize complications.  An educational handout further explaining the pathology and treatment options was given as well.  Questions were answered.  The patient expresses understanding & wishes to proceed with surgery.     Elspeth KYM Schultze, MD, FACS, MASCRS Esophageal, Gastrointestinal & Colorectal Surgery Robotic and Minimally Invasive Surgery  [facility] [address], [address] [place], [place] [address] [phone] Fax [phone] Main  CONTACT INFORMATION: Weekday (9AM-5PM): Call [facility] main office at [phone] Weeknight (5PM-9AM) or Weekend/Holiday: Check EPIC Web Links tab & use AMION (password  [email or web address]) for General Surgery [facility]  coverage  Please, DO NOT use SecureChat  (it is not reliable communication to reach operating surgeons & will lead to a delay in care).   Epic staff messaging available for outptient concerns needing 1-2 business day response.     10/19/2023

## 2023-10-19 NOTE — Anesthesia Preprocedure Evaluation (Signed)
 Anesthesia Evaluation  Patient identified by MRN, date of birth, ID band Patient awake    Reviewed: Allergy & Precautions, NPO status , Patient's Chart, lab work & pertinent test results  Airway Mallampati: II  TM Distance: >3 FB Neck ROM: Full    Dental  (+) Dental Advisory Given   Pulmonary neg pulmonary ROS   breath sounds clear to auscultation       Cardiovascular negative cardio ROS  Rhythm:Regular Rate:Normal     Neuro/Psych negative neurological ROS     GI/Hepatic Neg liver ROS,GERD  ,,  Endo/Other  negative endocrine ROS    Renal/GU negative Renal ROS     Musculoskeletal   Abdominal   Peds  Hematology negative hematology ROS (+)   Anesthesia Other Findings   Reproductive/Obstetrics                              Anesthesia Physical Anesthesia Plan  ASA: 2  Anesthesia Plan: General   Post-op Pain Management: Tylenol  PO (pre-op)*, Celebrex  PO (pre-op)* and Gabapentin  PO (pre-op)*   Induction: Intravenous  PONV Risk Score and Plan: 3 and Dexamethasone, Ondansetron , Treatment may vary due to age or medical condition and Midazolam   Airway Management Planned: Oral ETT  Additional Equipment:   Intra-op Plan:   Post-operative Plan: Extubation in OR  Informed Consent: I have reviewed the patients History and Physical, chart, labs and discussed the procedure including the risks, benefits and alternatives for the proposed anesthesia with the patient or authorized representative who has indicated his/her understanding and acceptance.     Dental advisory given  Plan Discussed with: CRNA  Anesthesia Plan Comments:         Anesthesia Quick Evaluation

## 2023-10-19 NOTE — Transfer of Care (Signed)
 Immediate Anesthesia Transfer of Care Note  Patient: Edward Carpenter  Procedure(s) Performed: LAPAROSCOPIC CHOLECYSTECTOMY WITH INTRAOPERATIVE CHOLANGIOGRAM (Abdomen) BIOPSY, LIVER  Patient Location: PACU  Anesthesia Type:General  Level of Consciousness: drowsy and patient cooperative  Airway & Oxygen Therapy: Patient Spontanous Breathing  Post-op Assessment: Report given to RN and Post -op Vital signs reviewed and stable  Post vital signs: Reviewed and stable  Last Vitals:  Vitals Value Taken Time  BP 139/92 10/19/23 08:54  Temp    Pulse 83 10/19/23 08:55  Resp 17 10/19/23 08:55  SpO2 96 % 10/19/23 08:55  Vitals shown include unfiled device data.  Last Pain:  Vitals:   10/19/23 0554  TempSrc: Oral         Complications: No notable events documented.

## 2023-10-20 ENCOUNTER — Encounter (HOSPITAL_COMMUNITY): Payer: Self-pay | Admitting: Surgery

## 2023-10-20 LAB — SURGICAL PATHOLOGY

## 2023-10-20 NOTE — Anesthesia Postprocedure Evaluation (Signed)
 Anesthesia Post Note  Patient: Edward Carpenter  Procedure(s) Performed: LAPAROSCOPIC CHOLECYSTECTOMY WITH INTRAOPERATIVE CHOLANGIOGRAM (Abdomen) BIOPSY, LIVER     Patient location during evaluation: PACU Anesthesia Type: General Level of consciousness: awake and alert Pain management: pain level controlled Vital Signs Assessment: post-procedure vital signs reviewed and stable Respiratory status: spontaneous breathing, nonlabored ventilation, respiratory function stable and patient connected to nasal cannula oxygen Cardiovascular status: blood pressure returned to baseline and stable Postop Assessment: no apparent nausea or vomiting Anesthetic complications: no   No notable events documented.  Last Vitals:  Vitals:   10/19/23 1015 10/19/23 1024  BP: 112/78 115/82  Pulse: 63 82  Resp: 12 12  Temp: 36.5 C 36.5 C  SpO2: 98% 97%    Last Pain:  Vitals:   10/19/23 1024  TempSrc: Oral  PainSc: 5                  Makira Holleman E

## 2023-10-21 ENCOUNTER — Encounter (HOSPITAL_COMMUNITY): Payer: Self-pay

## 2023-10-21 ENCOUNTER — Emergency Department (HOSPITAL_COMMUNITY)

## 2023-10-21 ENCOUNTER — Other Ambulatory Visit: Payer: Self-pay

## 2023-10-21 ENCOUNTER — Inpatient Hospital Stay (HOSPITAL_COMMUNITY)
Admission: EM | Admit: 2023-10-21 | Discharge: 2023-10-25 | DRG: 395 | Disposition: A | Discharge status: Home / Self Care | Attending: Surgery | Admitting: Surgery

## 2023-10-21 ENCOUNTER — Observation Stay (HOSPITAL_COMMUNITY)

## 2023-10-21 DIAGNOSIS — K7581 Nonalcoholic steatohepatitis (NASH): Secondary | ICD-10-CM | POA: Diagnosis present

## 2023-10-21 DIAGNOSIS — Z5941 Food insecurity: Secondary | ICD-10-CM | POA: Diagnosis not present

## 2023-10-21 DIAGNOSIS — R945 Abnormal results of liver function studies: Secondary | ICD-10-CM | POA: Diagnosis not present

## 2023-10-21 DIAGNOSIS — K9186 Retained cholelithiasis following cholecystectomy: Secondary | ICD-10-CM | POA: Diagnosis present

## 2023-10-21 DIAGNOSIS — R7989 Other specified abnormal findings of blood chemistry: Secondary | ICD-10-CM | POA: Diagnosis present

## 2023-10-21 DIAGNOSIS — R932 Abnormal findings on diagnostic imaging of liver and biliary tract: Secondary | ICD-10-CM | POA: Diagnosis not present

## 2023-10-21 DIAGNOSIS — K2950 Unspecified chronic gastritis without bleeding: Secondary | ICD-10-CM | POA: Diagnosis present

## 2023-10-21 DIAGNOSIS — Z8249 Family history of ischemic heart disease and other diseases of the circulatory system: Secondary | ICD-10-CM

## 2023-10-21 DIAGNOSIS — R7401 Elevation of levels of liver transaminase levels: Secondary | ICD-10-CM | POA: Diagnosis present

## 2023-10-21 DIAGNOSIS — Z823 Family history of stroke: Secondary | ICD-10-CM | POA: Diagnosis not present

## 2023-10-21 DIAGNOSIS — F419 Anxiety disorder, unspecified: Secondary | ICD-10-CM | POA: Diagnosis present

## 2023-10-21 DIAGNOSIS — K838 Other specified diseases of biliary tract: Secondary | ICD-10-CM | POA: Diagnosis not present

## 2023-10-21 DIAGNOSIS — R0602 Shortness of breath: Secondary | ICD-10-CM | POA: Diagnosis not present

## 2023-10-21 DIAGNOSIS — I899 Noninfective disorder of lymphatic vessels and lymph nodes, unspecified: Secondary | ICD-10-CM | POA: Diagnosis not present

## 2023-10-21 DIAGNOSIS — K5900 Constipation, unspecified: Secondary | ICD-10-CM | POA: Diagnosis not present

## 2023-10-21 DIAGNOSIS — R17 Unspecified jaundice: Secondary | ICD-10-CM | POA: Diagnosis not present

## 2023-10-21 DIAGNOSIS — R935 Abnormal findings on diagnostic imaging of other abdominal regions, including retroperitoneum: Secondary | ICD-10-CM | POA: Diagnosis not present

## 2023-10-21 DIAGNOSIS — Z5948 Other specified lack of adequate food: Secondary | ICD-10-CM | POA: Diagnosis not present

## 2023-10-21 DIAGNOSIS — K8050 Calculus of bile duct without cholangitis or cholecystitis without obstruction: Secondary | ICD-10-CM

## 2023-10-21 DIAGNOSIS — R1013 Epigastric pain: Principal | ICD-10-CM | POA: Diagnosis present

## 2023-10-21 DIAGNOSIS — R188 Other ascites: Secondary | ICD-10-CM | POA: Diagnosis not present

## 2023-10-21 DIAGNOSIS — K8051 Calculus of bile duct without cholangitis or cholecystitis with obstruction: Secondary | ICD-10-CM | POA: Diagnosis present

## 2023-10-21 DIAGNOSIS — K8689 Other specified diseases of pancreas: Secondary | ICD-10-CM | POA: Diagnosis not present

## 2023-10-21 DIAGNOSIS — Z888 Allergy status to other drugs, medicaments and biological substances status: Secondary | ICD-10-CM

## 2023-10-21 DIAGNOSIS — R1010 Upper abdominal pain, unspecified: Secondary | ICD-10-CM | POA: Diagnosis not present

## 2023-10-21 DIAGNOSIS — Z9889 Other specified postprocedural states: Secondary | ICD-10-CM | POA: Diagnosis not present

## 2023-10-21 DIAGNOSIS — K8044 Calculus of bile duct with chronic cholecystitis without obstruction: Principal | ICD-10-CM | POA: Diagnosis present

## 2023-10-21 DIAGNOSIS — F418 Other specified anxiety disorders: Secondary | ICD-10-CM | POA: Diagnosis not present

## 2023-10-21 DIAGNOSIS — R918 Other nonspecific abnormal finding of lung field: Secondary | ICD-10-CM | POA: Diagnosis not present

## 2023-10-21 DIAGNOSIS — K2289 Other specified disease of esophagus: Secondary | ICD-10-CM | POA: Diagnosis not present

## 2023-10-21 DIAGNOSIS — K219 Gastro-esophageal reflux disease without esophagitis: Secondary | ICD-10-CM | POA: Diagnosis present

## 2023-10-21 DIAGNOSIS — R748 Abnormal levels of other serum enzymes: Secondary | ICD-10-CM | POA: Diagnosis not present

## 2023-10-21 DIAGNOSIS — K3189 Other diseases of stomach and duodenum: Secondary | ICD-10-CM | POA: Diagnosis not present

## 2023-10-21 DIAGNOSIS — K760 Fatty (change of) liver, not elsewhere classified: Secondary | ICD-10-CM | POA: Diagnosis present

## 2023-10-21 DIAGNOSIS — K589 Irritable bowel syndrome without diarrhea: Secondary | ICD-10-CM | POA: Diagnosis present

## 2023-10-21 DIAGNOSIS — K5730 Diverticulosis of large intestine without perforation or abscess without bleeding: Secondary | ICD-10-CM | POA: Diagnosis not present

## 2023-10-21 LAB — I-STAT CHEM 8, ED
BUN: 13 mg/dL (ref 6–20)
Calcium, Ion: 1.15 mmol/L (ref 1.15–1.40)
Chloride: 100 mmol/L (ref 98–111)
Creatinine, Ser: 1 mg/dL (ref 0.61–1.24)
Glucose, Bld: 109 mg/dL — ABNORMAL HIGH (ref 70–99)
HCT: 44 % (ref 39.0–52.0)
Hemoglobin: 15 g/dL (ref 13.0–17.0)
Potassium: 4.1 mmol/L (ref 3.5–5.1)
Sodium: 137 mmol/L (ref 135–145)
TCO2: 27 mmol/L (ref 22–32)

## 2023-10-21 LAB — CBC WITH DIFFERENTIAL/PLATELET
Abs Immature Granulocytes: 0.04 K/uL (ref 0.00–0.07)
Basophils Absolute: 0 K/uL (ref 0.0–0.1)
Basophils Relative: 0 %
Eosinophils Absolute: 0 K/uL (ref 0.0–0.5)
Eosinophils Relative: 0 %
HCT: 45.4 % (ref 39.0–52.0)
Hemoglobin: 15.1 g/dL (ref 13.0–17.0)
Immature Granulocytes: 0 %
Lymphocytes Relative: 4 %
Lymphs Abs: 0.5 K/uL — ABNORMAL LOW (ref 0.7–4.0)
MCH: 31.2 pg (ref 26.0–34.0)
MCHC: 33.3 g/dL (ref 30.0–36.0)
MCV: 93.8 fL (ref 80.0–100.0)
Monocytes Absolute: 1 K/uL (ref 0.1–1.0)
Monocytes Relative: 8 %
Neutro Abs: 10.2 K/uL — ABNORMAL HIGH (ref 1.7–7.7)
Neutrophils Relative %: 88 %
Platelets: 291 K/uL (ref 150–400)
RBC: 4.84 MIL/uL (ref 4.22–5.81)
RDW: 13 % (ref 11.5–15.5)
WBC: 11.8 K/uL — ABNORMAL HIGH (ref 4.0–10.5)
nRBC: 0 % (ref 0.0–0.2)

## 2023-10-21 LAB — COMPREHENSIVE METABOLIC PANEL WITH GFR
ALT: 243 U/L — ABNORMAL HIGH (ref 0–44)
AST: 258 U/L — ABNORMAL HIGH (ref 15–41)
Albumin: 4.2 g/dL (ref 3.5–5.0)
Alkaline Phosphatase: 125 U/L (ref 38–126)
Anion gap: 13 (ref 5–15)
BUN: 12 mg/dL (ref 6–20)
CO2: 24 mmol/L (ref 22–32)
Calcium: 9.6 mg/dL (ref 8.9–10.3)
Chloride: 101 mmol/L (ref 98–111)
Creatinine, Ser: 0.8 mg/dL (ref 0.61–1.24)
GFR, Estimated: >60 mL/min (ref >60)
Glucose, Bld: 112 mg/dL — ABNORMAL HIGH (ref 70–99)
Potassium: 3.9 mmol/L (ref 3.5–5.1)
Sodium: 138 mmol/L (ref 135–145)
Total Bilirubin: 2.2 mg/dL — ABNORMAL HIGH (ref 0.0–1.2)
Total Protein: 7.1 g/dL (ref 6.5–8.1)

## 2023-10-21 LAB — LIPASE, BLOOD: Lipase: 24 U/L (ref 11–51)

## 2023-10-21 MED ORDER — TECHNETIUM TC 99M MEBROFENIN IV KIT
7.4100 | PACK | Freq: Once | INTRAVENOUS | Status: AC
  Administered 2023-10-21: 7.41 via INTRAVENOUS

## 2023-10-21 MED ORDER — GADOBUTROL 1 MMOL/ML IV SOLN
7.0000 mL | Freq: Once | INTRAVENOUS | Status: AC | PRN
Start: 2023-10-21 — End: 2023-10-21
  Administered 2023-10-21: 7 mL via INTRAVENOUS

## 2023-10-21 MED ORDER — PANTOPRAZOLE SODIUM 40 MG PO TBEC: 40.0000 mg | DELAYED_RELEASE_TABLET | Freq: Every day | ORAL | Status: DC | PRN

## 2023-10-21 MED ORDER — IOHEXOL 300 MG/ML  SOLN
100.0000 mL | Freq: Once | INTRAMUSCULAR | Status: AC | PRN
  Administered 2023-10-21: 100 mL via INTRAVENOUS

## 2023-10-21 MED ORDER — CLONAZEPAM 0.5 MG PO TABS
0.5000 mg | ORAL_TABLET | Freq: Two times a day (BID) | ORAL | Status: DC | PRN
Start: 2023-10-21 — End: 2023-10-23

## 2023-10-21 MED ORDER — LACTATED RINGERS IV BOLUS
1000.0000 mL | Freq: Once | INTRAVENOUS | Status: AC
  Administered 2023-10-21: 1000 mL via INTRAVENOUS

## 2023-10-21 MED ORDER — PROCHLORPERAZINE EDISYLATE 10 MG/2ML IJ SOLN
5.0000 mg | Freq: Four times a day (QID) | INTRAMUSCULAR | Status: DC | PRN
  Administered 2023-10-22: 10 mg via INTRAVENOUS
  Filled 2023-10-21: qty 2

## 2023-10-21 MED ORDER — DIPHENHYDRAMINE HCL 12.5 MG/5ML PO ELIX: 12.5000 mg | ORAL_SOLUTION | Freq: Four times a day (QID) | ORAL | Status: DC | PRN

## 2023-10-21 MED ORDER — MELATONIN 3 MG PO TABS: 3.0000 mg | ORAL_TABLET | Freq: Every evening | ORAL | Status: DC | PRN

## 2023-10-21 MED ORDER — HYDROMORPHONE HCL 1 MG/ML IJ SOLN
1.0000 mg | Freq: Once | INTRAMUSCULAR | Status: AC
  Administered 2023-10-21: 1 mg via INTRAVENOUS
  Filled 2023-10-21: qty 1

## 2023-10-21 MED ORDER — KCL IN DEXTROSE-NACL 20-5-0.45 MEQ/L-%-% IV SOLN
INTRAVENOUS | Status: AC
Start: 2023-10-21 — End: 2023-10-22
  Filled 2023-10-21 (×3): qty 1000

## 2023-10-21 MED ORDER — ONDANSETRON 4 MG PO TBDP: 4.0000 mg | ORAL_TABLET | Freq: Four times a day (QID) | ORAL | Status: DC | PRN

## 2023-10-21 MED ORDER — METHOCARBAMOL 1000 MG/10ML IJ SOLN
500.0000 mg | Freq: Three times a day (TID) | INTRAMUSCULAR | Status: DC | PRN
  Administered 2023-10-21 – 2023-10-23 (×2): 500 mg via INTRAVENOUS
  Filled 2023-10-21 (×2): qty 10

## 2023-10-21 MED ORDER — ONDANSETRON HCL 4 MG PO TABS: 4.0000 mg | ORAL_TABLET | Freq: Three times a day (TID) | ORAL | Status: DC | PRN

## 2023-10-21 MED ORDER — ONDANSETRON HCL 4 MG/2ML IJ SOLN
4.0000 mg | Freq: Four times a day (QID) | INTRAMUSCULAR | Status: DC | PRN
  Administered 2023-10-22 – 2023-10-24 (×2): 4 mg via INTRAVENOUS
  Filled 2023-10-21 (×2): qty 2

## 2023-10-21 MED ORDER — SENNA 8.6 MG PO TABS
1.0000 | ORAL_TABLET | Freq: Two times a day (BID) | ORAL | Status: DC
  Administered 2023-10-21: 8.6 mg via ORAL
  Filled 2023-10-21 (×2): qty 1

## 2023-10-21 MED ORDER — PROCHLORPERAZINE MALEATE 10 MG PO TABS: 10.0000 mg | ORAL_TABLET | Freq: Four times a day (QID) | ORAL | Status: DC | PRN

## 2023-10-21 MED ORDER — SODIUM CHLORIDE 0.9 % IV BOLUS
1000.0000 mL | Freq: Once | INTRAVENOUS | Status: AC
  Administered 2023-10-21: 1000 mL via INTRAVENOUS

## 2023-10-21 MED ORDER — ACETAMINOPHEN 500 MG PO TABS
1000.0000 mg | ORAL_TABLET | Freq: Four times a day (QID) | ORAL | Status: DC
Start: 2023-10-21 — End: 2023-10-23

## 2023-10-21 MED ORDER — METHOCARBAMOL 500 MG PO TABS: 500.0000 mg | ORAL_TABLET | Freq: Three times a day (TID) | ORAL | Status: DC | PRN

## 2023-10-21 MED ORDER — HYDROMORPHONE HCL 1 MG/ML IJ SOLN
0.5000 mg | INTRAMUSCULAR | Status: DC | PRN
  Administered 2023-10-21 – 2023-10-22 (×11): 1 mg via INTRAVENOUS
  Filled 2023-10-21 (×12): qty 1

## 2023-10-21 MED ORDER — POLYETHYLENE GLYCOL 3350 17 G PO PACK
17.0000 g | PACK | Freq: Every day | ORAL | Status: DC
  Administered 2023-10-23 – 2023-10-25 (×2): 17 g via ORAL
  Filled 2023-10-21 (×3): qty 1

## 2023-10-21 MED ORDER — ENOXAPARIN SODIUM 40 MG/0.4ML IJ SOSY
40.0000 mg | PREFILLED_SYRINGE | INTRAMUSCULAR | Status: DC
  Filled 2023-10-21 (×2): qty 0.4

## 2023-10-21 MED ORDER — MIDAZOLAM HCL 2 MG/2ML IJ SOLN
1.0000 mg | Freq: Once | INTRAMUSCULAR | Status: AC
  Administered 2023-10-21: 1 mg via INTRAVENOUS
  Filled 2023-10-21: qty 2

## 2023-10-21 MED ORDER — DIPHENHYDRAMINE HCL 50 MG/ML IJ SOLN: 12.5000 mg | Freq: Four times a day (QID) | INTRAMUSCULAR | Status: DC | PRN

## 2023-10-21 MED ORDER — TRAMADOL HCL 50 MG PO TABS: 50.0000 mg | ORAL_TABLET | Freq: Four times a day (QID) | ORAL | Status: DC | PRN

## 2023-10-21 MED ORDER — ONDANSETRON HCL 4 MG/2ML IJ SOLN
4.0000 mg | Freq: Once | INTRAMUSCULAR | Status: AC
  Administered 2023-10-21: 4 mg via INTRAVENOUS
  Filled 2023-10-21: qty 2

## 2023-10-21 MED ORDER — IOHEXOL 350 MG/ML SOLN
75.0000 mL | Freq: Once | INTRAVENOUS | Status: AC | PRN
  Administered 2023-10-21: 75 mL via INTRAVENOUS

## 2023-10-21 NOTE — Plan of Care (Signed)

## 2023-10-21 NOTE — Progress Notes (Signed)
 MEWS Progress Note  Patient Details Name: Edward Carpenter MRN: [id number] DOB: 25-Apr-1970 Today's Date: 10/21/2023   MEWS Flowsheet Documentation:  Assess: MEWS Score Temp: 98.4 F (36.9 C) BP: (!) 140/95 MAP (mmHg): 109 Pulse Rate: (!) 111 ECG Heart Rate: (!) 106 Resp: 16 Level of Consciousness: Alert SpO2: 98 % O2 Device: Room Air O2 Flow Rate (L/min): 2 L/min Assess: MEWS Score MEWS Temp: 0 MEWS Systolic: 0 MEWS Pulse: 2 MEWS RR: 0 MEWS LOC: 0 MEWS Score: 2 MEWS Score Color: Yellow Assess: SIRS CRITERIA SIRS Temperature : 0 SIRS Respirations : 0 SIRS Pulse: 1 SIRS WBC: 0 SIRS Score Sum : 1 Assess: if the MEWS score is Yellow or Red Were vital signs accurate and taken at a resting state?: Yes Does the patient meet 2 or more of the SIRS criteria?: No MEWS guidelines implemented : Yes, yellow Treat MEWS Interventions: Considered administering scheduled or prn medications/treatments as ordered Take Vital Signs Increase Vital Sign Frequency : Yellow: Q2hr x1, continue Q4hrs until patient remains green for 12hrs Escalate MEWS: Escalate: Yellow: Discuss with charge nurse and consider notifying provider and/or RRT Notify: Charge Nurse/RN Name of Charge Nurse/RN Notified: Brigitt Mcclish,RN Provider Notification Provider Name/Title: Thomas,MD Date Provider Notified: 10/21/23 Time Provider Notified: 1700 (By Rapid Response nurse) Method of Notification: Call Notification Reason: Change in status Provider response: See new orders Date of Provider Response: 10/21/23 Time of Provider Response: 1700 Notify: Rapid Response Name of Rapid Response RN Notified: [name] Date Rapid Response Notified: 10/21/23 Time Rapid Response Notified: 1650      Damin Salido E Pelham Hennick 10/21/2023, 5:21 PM

## 2023-10-21 NOTE — ED Triage Notes (Addendum)
 Pt BIBA from home, c/o epigastric pain started an hour ago, SOB, Nausea .  Denies vomiting. Had gallbladder removed 2 days ago.  VSS

## 2023-10-21 NOTE — ED Notes (Signed)
 SpO2 around 91% after dilaudid . Placed on 2L [place]

## 2023-10-21 NOTE — ED Notes (Addendum)
 Paged to Gross, MD @ [phone]

## 2023-10-21 NOTE — ED Notes (Addendum)
 Odetta Bunde RN ED charge nurse instructed this tech to transport pt to admit room now

## 2023-10-21 NOTE — H&P (Signed)
 Edward Carpenter is an 53 y.o. male.   Chief Complaint: chest and abdominal pain HPI:  Patient came to the emergency department today after experiencing sudden onset chest and upper abdominal pain at 2 AM.  He had undergone a single site laparoscopic cholecystectomy and liver biopsy on 10/19/2023 by Dr. Sheldon.  This was uneventful.  Preoperative diagnoses have been chronic cholecystitis.  Liver biopsy was performed because of appearance of steatohepatitis.  Both of these diagnoses were confirmed on pathology.  The patient described this pain as sharp and going up to his left shoulder and now to his back.  He had first felt like something was stuck.  He had some nausea but no vomiting.  He felt significantly like he could not breathe or take a deep breath.  He is clearly in distress.  He denies fevers or chills at home.  He has had some constipation and has not had any bowel movement since surgery.  He has been eating liquids until last night.    He has gotten several doses of Dilaudid  in the emergency department which have helped, but they have not made the sensation of difficulty breathing or difficulty taking a deep breath better.  Past Medical History:  Diagnosis Date   Allergy    Anxiety 2010   Depression    Diverticulitis    Family history of adverse reaction to anesthesia    mother cannot go under due to coginetal defect   GERD (gastroesophageal reflux disease)     Past Surgical History:  Procedure Laterality Date   arm surgery Right    reconstruction   CHOLECYSTECTOMY N/A 10/19/2023   Procedure: LAPAROSCOPIC CHOLECYSTECTOMY WITH INTRAOPERATIVE CHOLANGIOGRAM;  Surgeon: Sheldon Standing, MD;  Location: [facility];  Service: General;  Laterality: N/A;  SINGLE SITE   COLONOSCOPY     x2   LIVER BIOPSY N/A 10/19/2023   Procedure: BIOPSY, LIVER;  Surgeon: Sheldon Standing, MD;  Location: [facility];  Service: General;  Laterality: N/A;  POSSIBLE NEEDLE CORE BIOPSY OF LIVER    Family History  Problem  Relation Age of Onset   Valvular heart disease Mother    Anxiety disorder Mother    Stroke Father    Anxiety disorder Sister    Social History:  reports that he has never smoked. He has never used smokeless tobacco. He reports current alcohol use of about 4.0 standard drinks of alcohol per week. He reports that he does not use drugs.  Allergies:  Allergies  Allergen Reactions   Prednisone Other (See Comments)    syncope   Meds:  acetaminophen  (TYLENOL ) 500 MG tablet ondansetron  (ZOFRAN ) 4 MG tablet pantoprazole  (PROTONIX ) 40 MG tablet traMADol  (ULTRAM ) 50 MG tablet clonazePAM  (KLONOPIN ) 0.5 MG tablet   Results for orders placed or performed during the hospital encounter of 10/21/23 (from the past 48 hours)  Comprehensive metabolic panel     Status: Abnormal   Collection Time: 10/21/23  3:35 AM  Result Value Ref Range   Sodium 138 135 - 145 mmol/L   Potassium 3.9 3.5 - 5.1 mmol/L   Chloride 101 98 - 111 mmol/L   CO2 24 22 - 32 mmol/L   Glucose, Bld 112 (H) 70 - 99 mg/dL    Comment: Glucose reference range applies only to samples taken after fasting for at least 8 hours.   BUN 12 6 - 20 mg/dL   Creatinine, Ser 9.19 0.61 - 1.24 mg/dL   Calcium  9.6 8.9 - 10.3 mg/dL   Total  Protein 7.1 6.5 - 8.1 g/dL   Albumin 4.2 3.5 - 5.0 g/dL   AST 741 (H) 15 - 41 U/L   ALT 243 (H) 0 - 44 U/L   Alkaline Phosphatase 125 38 - 126 U/L   Total Bilirubin 2.2 (H) 0.0 - 1.2 mg/dL   GFR, Estimated >39 >39 mL/min    Comment: (NOTE) Calculated using the CKD-EPI Creatinine Equation (2021)    Anion gap 13 5 - 15    Comment: Performed at [facility], [address], [place], [place] [address]  Lipase, blood     Status: None   Collection Time: 10/21/23  3:35 AM  Result Value Ref Range   Lipase 24 11 - 51 U/L    Comment: Performed at [facility], [address], [place], [place] [address]  CBC with Differential     Status: Abnormal   Collection Time:  10/21/23  3:35 AM  Result Value Ref Range   WBC 11.8 (H) 4.0 - 10.5 K/uL   RBC 4.84 4.22 - 5.81 MIL/uL   Hemoglobin 15.1 13.0 - 17.0 g/dL   HCT 54.5 60.9 - 47.9 %   MCV 93.8 80.0 - 100.0 fL   MCH 31.2 26.0 - 34.0 pg   MCHC 33.3 30.0 - 36.0 g/dL   RDW 86.9 88.4 - 84.4 %   Platelets 291 150 - 400 K/uL   nRBC 0.0 0.0 - 0.2 %   Neutrophils Relative % 88 %   Neutro Abs 10.2 (H) 1.7 - 7.7 K/uL   Lymphocytes Relative 4 %   Lymphs Abs 0.5 (L) 0.7 - 4.0 K/uL   Monocytes Relative 8 %   Monocytes Absolute 1.0 0.1 - 1.0 K/uL   Eosinophils Relative 0 %   Eosinophils Absolute 0.0 0.0 - 0.5 K/uL   Basophils Relative 0 %   Basophils Absolute 0.0 0.0 - 0.1 K/uL   Immature Granulocytes 0 %   Abs Immature Granulocytes 0.04 0.00 - 0.07 K/uL    Comment: Performed at [facility], [address] [place], [place] [address]  Dozier brew 8, ED     Status: Abnormal   Collection Time: 10/21/23  3:43 AM  Result Value Ref Range   Sodium 137 135 - 145 mmol/L   Potassium 4.1 3.5 - 5.1 mmol/L   Chloride 100 98 - 111 mmol/L   BUN 13 6 - 20 mg/dL   Creatinine, Ser 8.99 0.61 - 1.24 mg/dL   Glucose, Bld 890 (H) 70 - 99 mg/dL    Comment: Glucose reference range applies only to samples taken after fasting for at least 8 hours.   Calcium , Ion 1.15 1.15 - 1.40 mmol/L   TCO2 27 22 - 32 mmol/L   Hemoglobin 15.0 13.0 - 17.0 g/dL   HCT 55.9 60.9 - 47.9 %   CT ABDOMEN PELVIS W CONTRAST Result Date: 10/21/2023 CLINICAL DATA:  The patient had a laparoscopic cholecystectomy, intraoperative cholangiogram and liver biopsy 2 days ago, now with severe right upper abdominal pain. EXAM: CT ABDOMEN AND PELVIS WITH CONTRAST TECHNIQUE: Multidetector CT imaging of the abdomen and pelvis was performed using the standard protocol following bolus administration of intravenous contrast. RADIATION DOSE REDUCTION: This exam was performed according to the departmental dose-optimization program which includes automated  exposure control, adjustment of the mA and/or kV according to patient size and/or use of iterative reconstruction technique. CONTRAST:  OMNIPAQUE  IOHEXOL  300 MG/ML  SOLN COMPARISON:  CTs with IV contrast 12/29/2021 and  08/07/2017. FINDINGS: Lower chest: There are trace pleural effusions. Increased opacity in the posterior base of the lungs right-greater-than-left is most likely due to hypostatic/dependent atelectasis but not seen previously. Underlying infection is possible. Rest of the lung bases are clear. The cardiac size is normal. Hepatobiliary: Status post cholecystectomy with scattered nonlocalizing fluid and stranding in the gallbladder fossa, probably a normal postoperative finding. Inflammatory process or bile leak not strictly excluded. There is new capsular irregularity over the anterolateral aspect of segment 8. Anterolaterally in segment 8 there is a 4 cm in length linear hypodensity extending from the capsule into the parenchyma on 2:21. This could be the site of the patient's liver biopsy, otherwise, this could be operative trauma to the liver with a linear grade 3 laceration. There is a small volume of overlying low-density perihepatic fluid, but I would expect a larger amount of higher density perihepatic hemorrhage if this was a grade 3 laceration. Correlation with the operative report is recommended. Rest of the liver is slightly steatotic, and is homogeneous without mass enhancement. Pancreas: No abnormality. Spleen: No abnormality Adrenals/Urinary Tract: No abnormality. Stomach/Bowel: No dilatation or wall thickening including the appendix. Moderate retained stool ascending colon. Advanced sigmoid diverticulosis without acute diverticulitis. Vascular/Lymphatic: No significant vascular findings are present. No enlarged abdominal or pelvic lymph nodes. Reproductive: Prostate is unremarkable. Other: Open wound midline anterior mid abdominal wall, mild underlying stranding. Trace air noted  between muscular layers in the anterior right abdominal wall. Small volume of low-density anterior perihepatic fluid, small amount of associated perihepatic intraperitoneal air, suspect residual postoperative change. Free air is not seen elsewhere. There is trace ascites in posterior pelvis. No encapsulated fluid is seen. Small inguinal fat hernias. Musculoskeletal: No acute or significant osseous findings. IMPRESSION: 1. Status post cholecystectomy with scattered nonlocalizing fluid and stranding in the gallbladder fossa, probably a normal postoperative finding. Inflammatory process or bile leak not strictly excluded. 2. 4 cm in length linear hypodensity extending from the capsule into the parenchyma of segment 8 of the liver. This could be the site of the patient's liver biopsy, otherwise, this could be operative trauma to the liver with a linear grade 3 laceration. Correlation with the operative report is recommended. 3. Small volume of low-density perihepatic fluid, small amount of associated perihepatic intraperitoneal air, suspect residual postoperative change. Free air is not seen elsewhere. There is no space-occupying perihepatic or subcapsular intrahepatic hematoma. 4. Trace ascites in the posterior pelvis. No encapsulated fluid is seen. 5. Trace pleural effusions with increased opacity in the posterior base of the lungs right-greater-than-left, most likely due to hypostatic/dependent atelectasis but not seen previously. Underlying infection is possible. 6. Open wound midline anterior mid abdominal wall with mild underlying stranding. 7. Constipation and diverticulosis. Electronically Signed   By: Francis Quam M.D.   On: 10/21/2023 05:14    Review of Systems  Constitutional:  Positive for fatigue.  HENT: Negative.    Eyes: Negative.   Respiratory:  Positive for shortness of breath.   Cardiovascular:  Positive for chest pain.  Gastrointestinal:  Positive for abdominal distention, abdominal pain,  constipation and nausea.  Endocrine: Negative.   Genitourinary: Negative.   Musculoskeletal: Negative.   Skin:  Positive for pallor.  Allergic/Immunologic: Negative.   Neurological:  Positive for dizziness.  Hematological: Negative.   Psychiatric/Behavioral:  The patient is nervous/anxious.   All other systems reviewed and are negative.   Blood pressure (!) 145/93, pulse (!) 102, temperature 98 F (36.7 C), resp. rate 14, height  5' 8 (1.727 m), weight 72.1 kg, SpO2 97%. Physical Exam Vitals reviewed.  Constitutional:      General: He is in acute distress.     Appearance: He is well-developed and normal weight. He is ill-appearing and diaphoretic.  HENT:     Head: Normocephalic and atraumatic.     Mouth/Throat:     Mouth: Mucous membranes are moist.  Eyes:     Extraocular Movements: Extraocular movements intact.     Pupils: Pupils are equal, round, and reactive to light.  Cardiovascular:     Rate and Rhythm: Regular rhythm. Tachycardia present.  Pulmonary:     Effort: Pulmonary effort is normal. No respiratory distress.     Breath sounds: No stridor.  Abdominal:     General: Abdomen is protuberant.     Palpations: Abdomen is soft. There is no shifting dullness or fluid wave.     Tenderness: There is abdominal tenderness in the right upper quadrant and epigastric area. There is no rebound. Guarding: voluntary guarding.Negative signs include Murphy's sign.     Comments: Periumbilical dressing intact   Skin:    Capillary Refill: Capillary refill takes 2 to 3 seconds.     Coloration: Skin is pale.  Neurological:     Mental Status: He is alert and oriented to person, place, and time.  Psychiatric:        Mood and Affect: Mood is anxious.        Behavior: Behavior normal.      Assessment/Plan S/p lap chole with IOC and liver biopsy 10/19/2023 SG Chest pain/shoulder pain/abdominal pain Shortness of breath Elevated LFTs leukocytosis Liver abnormality on CT with some  perihepatic fluid constipation  Admit for observation Rule out PE Rule out bile leak Pain control Suspect liver abnormality from liver biopsy needle.  Unsure if sudden pain from tear at biopsy site?   Bowel regimen  NPO until after HIDA.     Jina Nephew, MD 10/21/2023, 9:13 AM

## 2023-10-21 NOTE — Significant Event (Signed)
 Rapid Response Event Note   Reason for Call :  Concern for level of care   Initial Focused Assessment:  Patient in bed alert and oriented x4, complaining of abdominal pain. He has received IV narcotics prior to transfer-see MAR. 3rd Floor charge nurse expressing concerns for telemetry needs.   Debby MD paged, verbal for telemetry orders.  Updated family at bedside. Patient verbalizes understanding.   Interventions:  Verbal order received to place Telemetry orders    Event Summary:   MD Notified: A. Debby MD Call [id number] Arrival Time: 1550 End Time: 1615  Barnie Clover, RN

## 2023-10-21 NOTE — ED Provider Notes (Signed)
 [place] EMERGENCY DEPARTMENT AT [facility] Provider Note   CSN: [id number] Arrival date & time: 10/21/23  0245     Patient presents with: Abdominal Pain   Edward Carpenter is a 53 y.o. male.   The history is provided by the patient.  Abdominal Pain Edward Carpenter is a 53 y.o. male who presents to the Emergency Department complaining of abdominal pain. He presents the emergency department for evaluation of epigastric abdominal pain that started one hour prior to arrival. He has associated nausea but no vomiting. He feels like something is stuck there. He feels like it is hard to breathe. No associated fever. He is status post cholecystectomy two days ago. He has been sources procedure but nothing like this. He did start eating food today. He has no known medical problems. He has had constipation since prior to the procedure.   Prior to Admission medications   Medication Sig Start Date End Date Taking? Authorizing Provider  acetaminophen  (TYLENOL ) 500 MG tablet Take 500 mg by mouth every 6 (six) hours as needed for mild pain (pain score 1-3) or moderate pain (pain score 4-6).   Yes [provider]  ondansetron  (ZOFRAN ) 4 MG tablet Take 1 tablet (4 mg total) by mouth every 8 (eight) hours as needed for nausea. 10/19/23  Yes Sheldon Standing, MD  pantoprazole  (PROTONIX ) 40 MG tablet Take 40 mg by mouth daily as needed (heartburn). 08/15/23  Yes [provider]  traMADol  (ULTRAM ) 50 MG tablet Take 1-2 tablets (50-100 mg total) by mouth every 6 (six) hours as needed for moderate pain (pain score 4-6) or severe pain (pain score 7-10). 10/19/23  Yes Sheldon Standing, MD  clonazePAM  (KLONOPIN ) 0.5 MG tablet TAKE 1/2 TO 1 TABLET BY MOUTH EVERY DAY AT BEDTIME FOR SLEEP AND ANXIETY Patient taking differently: Take 0.5 mg by mouth as needed for anxiety. 08/30/23   Allwardt, Alyssa M, PA-C    Allergies: Prednisone    Review of Systems  Gastrointestinal:  Positive for  abdominal pain.  All other systems reviewed and are negative.   Updated Vital Signs BP (!) 145/93   Pulse (!) 102   Temp 98 F (36.7 C)   Resp 14   Ht 5' 8 (1.727 m)   Wt 72.1 kg   SpO2 97%   BMI 24.18 kg/m   Physical Exam Vitals and nursing note reviewed.  Constitutional:      Appearance: He is well-developed.  HENT:     Head: Normocephalic and atraumatic.  Cardiovascular:     Rate and Rhythm: Regular rhythm. Tachycardia present.     Heart sounds: No murmur heard. Pulmonary:     Effort: Pulmonary effort is normal.     Breath sounds: Normal breath sounds.  Abdominal:     Palpations: Abdomen is soft.     Tenderness: There is guarding.     Comments: Surgical dressing in place, clean, dry, intact  Musculoskeletal:        General: No tenderness.  Skin:    General: Skin is warm and dry.  Neurological:     Mental Status: He is alert and oriented to person, place, and time.  Psychiatric:        Behavior: Behavior normal.     (all labs ordered are listed, but only abnormal results are displayed) Labs Reviewed  COMPREHENSIVE METABOLIC PANEL WITH GFR - Abnormal; Notable for the following components:      Result Value   Glucose, Bld 112 (*)  AST 258 (*)    ALT 243 (*)    Total Bilirubin 2.2 (*)    All other components within normal limits  CBC WITH DIFFERENTIAL/PLATELET - Abnormal; Notable for the following components:   WBC 11.8 (*)    Neutro Abs 10.2 (*)    Lymphs Abs 0.5 (*)    All other components within normal limits  I-STAT CHEM 8, ED - Abnormal; Notable for the following components:   Glucose, Bld 109 (*)    All other components within normal limits  LIPASE, BLOOD    EKG: EKG Interpretation Date/Time:  Saturday October 21 2023 02:57:21 EDT Ventricular Rate:  106 PR Interval:  149 QRS Duration:  100 QT Interval:  343 QTC Calculation: 456 R Axis:   -30  Text Interpretation: Sinus tachycardia Left axis deviation Low voltage, precordial leads  Confirmed by Griselda Norris [phone]) on 10/21/2023 3:07:09 AM  Radiology: CT ABDOMEN PELVIS W CONTRAST Result Date: 10/21/2023 CLINICAL DATA:  The patient had a laparoscopic cholecystectomy, intraoperative cholangiogram and liver biopsy 2 days ago, now with severe right upper abdominal pain. EXAM: CT ABDOMEN AND PELVIS WITH CONTRAST TECHNIQUE: Multidetector CT imaging of the abdomen and pelvis was performed using the standard protocol following bolus administration of intravenous contrast. RADIATION DOSE REDUCTION: This exam was performed according to the departmental dose-optimization program which includes automated exposure control, adjustment of the mA and/or kV according to patient size and/or use of iterative reconstruction technique. CONTRAST:  OMNIPAQUE  IOHEXOL  300 MG/ML  SOLN COMPARISON:  CTs with IV contrast 12/29/2021 and 08/07/2017. FINDINGS: Lower chest: There are trace pleural effusions. Increased opacity in the posterior base of the lungs right-greater-than-left is most likely due to hypostatic/dependent atelectasis but not seen previously. Underlying infection is possible. Rest of the lung bases are clear. The cardiac size is normal. Hepatobiliary: Status post cholecystectomy with scattered nonlocalizing fluid and stranding in the gallbladder fossa, probably a normal postoperative finding. Inflammatory process or bile leak not strictly excluded. There is new capsular irregularity over the anterolateral aspect of segment 8. Anterolaterally in segment 8 there is a 4 cm in length linear hypodensity extending from the capsule into the parenchyma on 2:21. This could be the site of the patient's liver biopsy, otherwise, this could be operative trauma to the liver with a linear grade 3 laceration. There is a small volume of overlying low-density perihepatic fluid, but I would expect a larger amount of higher density perihepatic hemorrhage if this was a grade 3 laceration. Correlation with the  operative report is recommended. Rest of the liver is slightly steatotic, and is homogeneous without mass enhancement. Pancreas: No abnormality. Spleen: No abnormality Adrenals/Urinary Tract: No abnormality. Stomach/Bowel: No dilatation or wall thickening including the appendix. Moderate retained stool ascending colon. Advanced sigmoid diverticulosis without acute diverticulitis. Vascular/Lymphatic: No significant vascular findings are present. No enlarged abdominal or pelvic lymph nodes. Reproductive: Prostate is unremarkable. Other: Open wound midline anterior mid abdominal wall, mild underlying stranding. Trace air noted between muscular layers in the anterior right abdominal wall. Small volume of low-density anterior perihepatic fluid, small amount of associated perihepatic intraperitoneal air, suspect residual postoperative change. Free air is not seen elsewhere. There is trace ascites in posterior pelvis. No encapsulated fluid is seen. Small inguinal fat hernias. Musculoskeletal: No acute or significant osseous findings. IMPRESSION: 1. Status post cholecystectomy with scattered nonlocalizing fluid and stranding in the gallbladder fossa, probably a normal postoperative finding. Inflammatory process or bile leak not strictly excluded. 2. 4 cm in length  linear hypodensity extending from the capsule into the parenchyma of segment 8 of the liver. This could be the site of the patient's liver biopsy, otherwise, this could be operative trauma to the liver with a linear grade 3 laceration. Correlation with the operative report is recommended. 3. Small volume of low-density perihepatic fluid, small amount of associated perihepatic intraperitoneal air, suspect residual postoperative change. Free air is not seen elsewhere. There is no space-occupying perihepatic or subcapsular intrahepatic hematoma. 4. Trace ascites in the posterior pelvis. No encapsulated fluid is seen. 5. Trace pleural effusions with increased opacity  in the posterior base of the lungs right-greater-than-left, most likely due to hypostatic/dependent atelectasis but not seen previously. Underlying infection is possible. 6. Open wound midline anterior mid abdominal wall with mild underlying stranding. 7. Constipation and diverticulosis. Electronically Signed   By: Francis Quam M.D.   On: 10/21/2023 05:14   DG Cholangiogram Operative Result Date: 10/20/2023 CLINICAL DATA:  [id number] Surgery, elective [id number] C-arm for Intraoperative cholangiogram EXAM: INTRAOPERATIVE CHOLANGIOGRAM COMPARISON:  CT AP, 12/29/2021. US  Abdomen, 08/31/2022. NM HIDA, 04/03/2023. FLUOROSCOPY: Exposure Index (as provided by the fluoroscopic device): 2.1 mGy Kerma FINDINGS: Limited oblique planar images of the RIGHT upper quadrant obtained C-arm. Images demonstrating laparoscopic instrumentation, cystic duct cannulation and antegrade cholangiogram. Free spillage of contrast into the duodenum. No biliary ductal dilation. No evidence of biliary filling defect is demonstrated. IMPRESSION: Fluoroscopic imaging for intraoperative cholangiogram. For complete description of intra procedural findings, please see performing service dictation. Electronically Signed   By: Thom Hall M.D.   On: 10/20/2023 08:06     Procedures   Medications Ordered in the ED  lactated ringers  bolus 1,000 mL (has no administration in time range)  HYDROmorphone  (DILAUDID ) injection 0.5-1 mg (has no administration in time range)  HYDROmorphone  (DILAUDID ) injection 1 mg (1 mg Intravenous Given 10/21/23 0334)  ondansetron  (ZOFRAN ) injection 4 mg (4 mg Intravenous Given 10/21/23 0333)  sodium chloride  0.9 % bolus 1,000 mL (0 mLs Intravenous Stopped 10/21/23 0431)  iohexol  (OMNIPAQUE ) 300 MG/ML solution 100 mL (100 mLs Intravenous Contrast Given 10/21/23 0429)  HYDROmorphone  (DILAUDID ) injection 1 mg (1 mg Intravenous Given 10/21/23 0451)  midazolam  (VERSED ) injection 1 mg (1 mg Intravenous Given 10/21/23 0520)                                     Medical Decision Making Amount and/or Complexity of Data Reviewed Labs: ordered. Radiology: ordered.  Risk Prescription drug management. Decision regarding hospitalization.   Patient status post cholecystectomy here for evaluation of severe abdominal pain. He is uncomfortable appearing on evaluation with significant tenderness on palpation. Labs significant for elevation transaminases. CT abdomen pelvis was obtained, which is not demonstrate clear source of his symptoms. Discussed with Dr. Aron with general surgery - she will see the patient consult. She does recommend HIDA scan. Patient updated of findings of studies and need for ongoing workup and he is in agreement with plan.     Final diagnoses:  Epigastric pain  Transaminitis    ED Discharge Orders     None          Griselda Norris, MD 10/21/23 [phone]

## 2023-10-22 ENCOUNTER — Encounter (HOSPITAL_COMMUNITY): Payer: Self-pay | Admitting: Surgery

## 2023-10-22 ENCOUNTER — Observation Stay (HOSPITAL_COMMUNITY): Admitting: Anesthesiology

## 2023-10-22 ENCOUNTER — Encounter (HOSPITAL_COMMUNITY)
Admission: EM | Disposition: A | Discharge status: Home / Self Care | Payer: Self-pay | Source: Home / Self Care | Attending: Surgery

## 2023-10-22 DIAGNOSIS — R1013 Epigastric pain: Principal | ICD-10-CM | POA: Diagnosis present

## 2023-10-22 DIAGNOSIS — K2950 Unspecified chronic gastritis without bleeding: Secondary | ICD-10-CM | POA: Diagnosis not present

## 2023-10-22 DIAGNOSIS — R7401 Elevation of levels of liver transaminase levels: Secondary | ICD-10-CM | POA: Diagnosis present

## 2023-10-22 DIAGNOSIS — K838 Other specified diseases of biliary tract: Secondary | ICD-10-CM | POA: Diagnosis not present

## 2023-10-22 DIAGNOSIS — K3189 Other diseases of stomach and duodenum: Secondary | ICD-10-CM

## 2023-10-22 DIAGNOSIS — K2289 Other specified disease of esophagus: Secondary | ICD-10-CM | POA: Diagnosis not present

## 2023-10-22 DIAGNOSIS — R945 Abnormal results of liver function studies: Secondary | ICD-10-CM | POA: Diagnosis not present

## 2023-10-22 DIAGNOSIS — K8050 Calculus of bile duct without cholangitis or cholecystitis without obstruction: Secondary | ICD-10-CM | POA: Diagnosis not present

## 2023-10-22 DIAGNOSIS — K8689 Other specified diseases of pancreas: Secondary | ICD-10-CM | POA: Diagnosis not present

## 2023-10-22 DIAGNOSIS — I899 Noninfective disorder of lymphatic vessels and lymph nodes, unspecified: Secondary | ICD-10-CM | POA: Diagnosis not present

## 2023-10-22 DIAGNOSIS — R17 Unspecified jaundice: Secondary | ICD-10-CM | POA: Diagnosis not present

## 2023-10-22 DIAGNOSIS — R932 Abnormal findings on diagnostic imaging of liver and biliary tract: Secondary | ICD-10-CM | POA: Diagnosis not present

## 2023-10-22 DIAGNOSIS — F418 Other specified anxiety disorders: Secondary | ICD-10-CM | POA: Diagnosis not present

## 2023-10-22 HISTORY — PX: EUS: SHX5427

## 2023-10-22 HISTORY — PX: ESOPHAGOGASTRODUODENOSCOPY: SHX5428

## 2023-10-22 LAB — COMPREHENSIVE METABOLIC PANEL WITH GFR
ALT: 487 U/L — ABNORMAL HIGH (ref 0–44)
AST: 267 U/L — ABNORMAL HIGH (ref 15–41)
Albumin: 3.7 g/dL (ref 3.5–5.0)
Alkaline Phosphatase: 237 U/L — ABNORMAL HIGH (ref 38–126)
Anion gap: 11 (ref 5–15)
BUN: 12 mg/dL (ref 6–20)
CO2: 26 mmol/L (ref 22–32)
Calcium: 9.3 mg/dL (ref 8.9–10.3)
Chloride: 101 mmol/L (ref 98–111)
Creatinine, Ser: 0.97 mg/dL (ref 0.61–1.24)
GFR, Estimated: >60 mL/min (ref >60)
Glucose, Bld: 114 mg/dL — ABNORMAL HIGH (ref 70–99)
Potassium: 5 mmol/L (ref 3.5–5.1)
Sodium: 138 mmol/L (ref 135–145)
Total Bilirubin: 8.2 mg/dL — ABNORMAL HIGH (ref 0.0–1.2)
Total Protein: 6.4 g/dL — ABNORMAL LOW (ref 6.5–8.1)

## 2023-10-22 LAB — CBC
HCT: 43.6 % (ref 39.0–52.0)
Hemoglobin: 13.5 g/dL (ref 13.0–17.0)
MCH: 29.8 pg (ref 26.0–34.0)
MCHC: 31 g/dL (ref 30.0–36.0)
MCV: 96.2 fL (ref 80.0–100.0)
Platelets: 260 K/uL (ref 150–400)
RBC: 4.53 MIL/uL (ref 4.22–5.81)
RDW: 12.9 % (ref 11.5–15.5)
WBC: 6.7 K/uL (ref 4.0–10.5)
nRBC: 0 % (ref 0.0–0.2)

## 2023-10-22 LAB — HIV ANTIBODY (ROUTINE TESTING W REFLEX): HIV Screen 4th Generation wRfx: NONREACTIVE

## 2023-10-22 SURGERY — EGD (ESOPHAGOGASTRODUODENOSCOPY)
Anesthesia: General

## 2023-10-22 MED ORDER — SODIUM CHLORIDE 0.9 % IV SOLN: INTRAVENOUS | Status: DC

## 2023-10-22 MED ORDER — FENTANYL CITRATE (PF) 100 MCG/2ML IJ SOLN
INTRAMUSCULAR | Status: AC
  Filled 2023-10-22: qty 2

## 2023-10-22 MED ORDER — PROPOFOL 10 MG/ML IV BOLUS
INTRAVENOUS | Status: DC | PRN
  Administered 2023-10-22: 30 mg via INTRAVENOUS
  Administered 2023-10-22: 20 mg via INTRAVENOUS

## 2023-10-22 MED ORDER — PROPOFOL 1000 MG/100ML IV EMUL
INTRAVENOUS | Status: AC
  Filled 2023-10-22: qty 400

## 2023-10-22 MED ORDER — PROPOFOL 1000 MG/100ML IV EMUL
INTRAVENOUS | Status: AC
  Filled 2023-10-22: qty 200

## 2023-10-22 MED ORDER — PROPOFOL 500 MG/50ML IV EMUL
INTRAVENOUS | Status: AC
  Filled 2023-10-22: qty 200

## 2023-10-22 MED ORDER — PROPOFOL 500 MG/50ML IV EMUL
INTRAVENOUS | Status: DC | PRN
  Administered 2023-10-22: 180 ug/kg/min via INTRAVENOUS

## 2023-10-22 MED ORDER — LACTATED RINGERS IV SOLN: INTRAVENOUS | Status: DC | PRN

## 2023-10-22 NOTE — Anesthesia Preprocedure Evaluation (Signed)
 Anesthesia Evaluation  Patient identified by MRN, date of birth, ID band Patient awake    Reviewed: Allergy & Precautions, NPO status , Patient's Chart, lab work & pertinent test results  History of Anesthesia Complications Negative for: history of anesthetic complications  Airway Mallampati: I  TM Distance: >3 FB Neck ROM: Full    Dental  (+) Dental Advisory Given, Teeth Intact   Pulmonary neg pulmonary ROS, neg shortness of breath, neg sleep apnea, neg COPD, neg recent URI   breath sounds clear to auscultation       Cardiovascular negative cardio ROS  Rhythm:Regular     Neuro/Psych negative neurological ROS     GI/Hepatic ,GERD  ,,Lab Results      Component                Value               Date                      ALT                      487 (H)             10/22/2023                AST                      267 (H)             10/22/2023                ALKPHOS                  237 (H)             10/22/2023                BILITOT                  8.2 (H)             10/22/2023              Endo/Other  negative endocrine ROS    Renal/GU negative Renal ROS     Musculoskeletal   Abdominal   Peds  Hematology negative hematology ROS (+)   Anesthesia Other Findings   Reproductive/Obstetrics                              Anesthesia Physical Anesthesia Plan  ASA: 2  Anesthesia Plan: General   Post-op Pain Management: Minimal or no pain anticipated   Induction: Intravenous  PONV Risk Score and Plan: 2 and Ondansetron  and Dexamethasone  Airway Management Planned: Oral ETT  Additional Equipment: None  Intra-op Plan:   Post-operative Plan: Extubation in OR  Informed Consent: I have reviewed the patients History and Physical, chart, labs and discussed the procedure including the risks, benefits and alternatives for the proposed anesthesia with the patient or authorized  representative who has indicated his/her understanding and acceptance.     Dental advisory given  Plan Discussed with: CRNA  Anesthesia Plan Comments:          Anesthesia Quick Evaluation

## 2023-10-22 NOTE — Consult Note (Signed)
 Referring Provider: [facility] Primary Care Physician:  Allwardt, Mardy HERO, PA-C Primary Gastroenterologist:  Dr. Elicia  Reason for Consultation: Abnormal LFTs/jaundice  HPI: Edward Carpenter is a 53 y.o. male admitted to the hospital with postop abdominal pain and jaundice.  Patient with history of intermittent abdominal pain for several years now.  Negative workup with ultrasound and CT scan in the past.  HIDA scan with EF in March 2025 showed elevated ejection fraction at 83% consistent with gallbladder hypokinesia.  He underwent laparoscopic cholecystectomy with IOC and core liver biopsy by Dr. Sheldon on October 19, 2023.  Was found to have findings of chronic cholecystitis with moderately dense omental ideation and thickening.  IOC was normal.  Presented to the hospital with worsening abdominal pain.  Initial blood work showed mild elevation in AST ALT around 200 and T. bili of 2.2.  Normal CBC.  Normal lipase.  Repeat labs this morning showed significantly elevated T. bili at 8.2.  GI was consulted for further workup.  Patient seen and examined at bedside.  He is complaining of intermittent epigastric and right upper quadrant pain.  Described as sensation of spasm and sharp pain.  Denies any blood in the stool or black stool.  Denies any nausea or vomiting.  Denies any fever or chills.  CT abdomen pelvis with IV contrast showed some postoperative changes.  Follow-up HIDA scan showed lack of biliary excretion concerning for biliary obstruction.  MRI MRCP with contrast showed no biloma, normal caliber CBD with no choledocholithiasis or biliary obstruction.   Previous GI history Patient with intermittent abdominal pain for several years.  Previous workup negative including ultrasound nd CT scan. 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 He is avoiding those 2 trigger factors which has resulted in significant improvement in symptoms.   Blood work  done by PCP in February 2025 showed mild elevated AST and ALT at 41 and 54 otherwise normal CBC, CMP, TSH and lipase. Negative H. pylori breath test.    Ultrasound right upper quadrant in July 2024 showed fatty liver otherwise no acute changes.  CT abdomen pelvis with IV contrast in November 2023 showed no acute changes   EGD and colonoscopy on Jun 23, 2017. EGD was normal. Biopsies were negative for H. pylori. Colonoscopy showed mild erythematous/ inflammatory changes surrounding diverticulum in the sigmoid colon. At the time of biopsy , purulent material came out from this area. 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 He was treated with ciprofloxacin and Flagyl.  Patient subsequently had an episode of abdominal pain and he was given Augmentin .  Because of ongoing abdominal pain CT scan was ordered which was done on August 07, 2017 which showed no acute changes. He started taking some dicyclomine with significant improvement in symptoms.   Past Medical History:  Diagnosis Date   Allergy    Anxiety 2010   Depression    Diverticulitis    Family history of adverse reaction to anesthesia    mother cannot go under due to coginetal defect   GERD (gastroesophageal reflux disease)     Past Surgical History:  Procedure Laterality Date   arm surgery Right    reconstruction   CHOLECYSTECTOMY N/A 10/19/2023   Procedure: LAPAROSCOPIC CHOLECYSTECTOMY WITH INTRAOPERATIVE CHOLANGIOGRAM;  Surgeon: Sheldon Standing, MD;  Location: [facility];  Service: General;  Laterality: N/A;  SINGLE SITE   COLONOSCOPY     x2   LIVER BIOPSY N/A 10/19/2023  Procedure: BIOPSY, LIVER;  Surgeon: Sheldon Standing, MD;  Location: [facility];  Service: General;  Laterality: N/A;  POSSIBLE NEEDLE CORE BIOPSY OF LIVER    Prior to Admission medications   Medication Sig Start Date End Date Taking? Authorizing Provider  acetaminophen  (TYLENOL ) 500 MG tablet Take 500 mg by mouth every 6 (six)  hours as needed for mild pain (pain score 1-3) or moderate pain (pain score 4-6).   Yes [provider]  ondansetron  (ZOFRAN ) 4 MG tablet Take 1 tablet (4 mg total) by mouth every 8 (eight) hours as needed for nausea. 10/19/23  Yes Sheldon Standing, MD  pantoprazole  (PROTONIX ) 40 MG tablet Take 40 mg by mouth daily as needed (heartburn). 08/15/23  Yes [provider]  traMADol  (ULTRAM ) 50 MG tablet Take 1-2 tablets (50-100 mg total) by mouth every 6 (six) hours as needed for moderate pain (pain score 4-6) or severe pain (pain score 7-10). 10/19/23  Yes Sheldon Standing, MD  clonazePAM  (KLONOPIN ) 0.5 MG tablet TAKE 1/2 TO 1 TABLET BY MOUTH EVERY DAY AT BEDTIME FOR SLEEP AND ANXIETY Patient taking differently: Take 0.5 mg by mouth as needed for anxiety. 08/30/23   Allwardt, Alyssa M, PA-C    Scheduled Meds:  acetaminophen   1,000 mg Oral Q6H   enoxaparin  (LOVENOX ) injection  40 mg Subcutaneous Q24H   polyethylene glycol  17 g Oral Daily   senna  1 tablet Oral BID   Continuous Infusions:  dextrose  5 % and 0.45 % NaCl with KCl 20 mEq/L 100 mL/hr at 10/22/23 0543   PRN Meds:.clonazePAM , diphenhydrAMINE  **OR** diphenhydrAMINE , HYDROmorphone  (DILAUDID ) injection, melatonin, methocarbamol  **OR** methocarbamol  (ROBAXIN ) injection, ondansetron  **OR** ondansetron  (ZOFRAN ) IV, ondansetron , pantoprazole , prochlorperazine  **OR** prochlorperazine , traMADol   Allergies as of 10/21/2023 - Review Complete 10/21/2023  Allergen Reaction Noted   Prednisone Other (See Comments) 12/30/2019    Family History  Problem Relation Age of Onset   Valvular heart disease Mother    Anxiety disorder Mother    Stroke Father    Anxiety disorder Sister     Social History   Socioeconomic History   Marital status: Married    Spouse name: Not on file   Number of children: Not on file   Years of education: Not on file   Highest education level: Bachelor's degree (e.g., BA, AB, BS)  Occupational History   Not  on file  Tobacco Use   Smoking status: Never   Smokeless tobacco: Never  Vaping Use   Vaping status: Never Used  Substance and Sexual Activity   Alcohol use: Yes    Alcohol/week: 4.0 standard drinks of alcohol    Types: 4 Glasses of wine per week   Drug use: No   Sexual activity: Yes    Birth control/protection: Condom  Other Topics Concern   Not on file  Social History Narrative   Media planner at [employer]    Social Drivers of Health   Financial Resource Strain: Medium Risk (03/12/2023)   Overall Financial Resource Strain (CARDIA)    Difficulty of Paying Living Expenses: Somewhat hard  Food Insecurity: Food Insecurity Present (10/21/2023)   Hunger Vital Sign    Worried About Running Out of Food in the Last Year: Sometimes true    Ran Out of Food in the Last Year: Sometimes true  Transportation Needs: No Transportation Needs (10/21/2023)   PRAPARE - Administrator, Civil Service (Medical): No    Lack of Transportation (Non-Medical): No  Physical Activity: Insufficiently Active (03/12/2023)  Exercise Vital Sign    Days of Exercise per Week: 1 day    Minutes of Exercise per Session: 20 min  Stress: Stress Concern Present (03/12/2023)   [employer] of Occupational Health - Occupational Stress Questionnaire    Feeling of Stress : Very much  Social Connections: Unknown (03/12/2023)   Social Connection and Isolation Panel    Frequency of Communication with Friends and Family: Once a week    Frequency of Social Gatherings with Friends and Family: Patient declined    Attends Religious Services: Never    Database administrator or Organizations: No    Attends Engineer, structural: Not on file    Marital Status: Married  Catering manager Violence: Not At Risk (10/21/2023)   Humiliation, Afraid, Rape, and Kick questionnaire    Fear of Current or Ex-Partner: No    Emotionally Abused: No    Physically Abused: No    Sexually Abused: No    Review of  Systems: All negative except as stated above in HPI.  Physical Exam: Vital signs: Vitals:   10/22/23 0544 10/22/23 0855  BP: (!) 133/94 (!) 128/91  Pulse: 96 96  Resp: 18 17  Temp: 98.7 F (37.1 C) 98.9 F (37.2 C)  SpO2: 95% 94%   Last BM Date : 10/18/23 General:   Alert,  Well-developed, well-nourished, pleasant and cooperative in NAD Normocephalic atraumatic Extraocular movement intact Lungs: No visible respiratory distress Heart:  Regular rate and rhythm; no murmurs, clicks, rubs,  or gallops. Abdomen: Epigastric and periumbilical tenderness to palpation but no peritoneal signs, abdomen is soft.  Bowel sound present. Alert and oriented x 3 Somewhat emotional Rectal:  Deferred  GI:  Lab Results: Recent Labs    10/21/23 0335 10/21/23 0343 10/22/23 0354  WBC 11.8*  --  6.7  HGB 15.1 15.0 13.5  HCT 45.4 44.0 43.6  PLT 291  --  260   BMET Recent Labs    10/21/23 0335 10/21/23 0343 10/22/23 0354  NA 138 137 138  K 3.9 4.1 5.0  CL 101 100 101  CO2 24  --  26  GLUCOSE 112* 109* 114*  BUN 12 13 12   CREATININE 0.80 1.00 0.97  CALCIUM  9.6  --  9.3   LFT Recent Labs    10/22/23 0354  PROT 6.4*  ALBUMIN 3.7  AST 267*  ALT 487*  ALKPHOS 237*  BILITOT 8.2*   PT/INR No results for input(s): LABPROT, INR in the last 72 hours.   Studies/Results: MR ABDOMEN WITH MRCP W CONTRAST Result Date: 10/21/2023 CLINICAL DATA:  Abdominal pain. Patient is 2 days status post cholecystectomy. EXAM: MRI ABDOMEN WITH CONTRAST (WITH MRCP) TECHNIQUE: Multiplanar multisequence MR imaging of the abdomen was performed following the administration of intravenous contrast. Heavily T2-weighted images of the biliary and pancreatic ducts were obtained, and three-dimensional MRCP images were rendered by post processing. CONTRAST:  7mL GADAVIST  GADOBUTROL  1 MMOL/ML IV SOLN COMPARISON:  Nuclear medicine hepatobiliary scan, same date. FINDINGS: Lower chest: Small bilateral pleural  effusions and streaky bibasilar atelectasis. No pericardial effusion. Hepatobiliary: No hepatic lesions or intrahepatic biliary dilatation. Normal caliber and course of the common bile duct. No common bile duct stones are identified. Pancreas:  No mass, inflammation or ductal dilatation. Spleen:  Within normal limits in size and appearance. Adrenals/Urinary Tract:  Adrenal glands and kidneys unremarkable. Stomach/Bowel: The stomach, duodenum, visualized small bowel and colon are unremarkable. Moderate gaseous distension of the visualized colon may suggest a postoperative ileus.  No small bowel dilatation. Vascular/Lymphatic: No pathologically enlarged lymph nodes identified. No abdominal aortic aneurysm demonstrated. Other: Small amount of free air is noted and not unexpected this soon after surgery. There is also small amount of fluid/inflammation/edema noted in the gallbladder fossa and in the periportal region and anterior pararenal space all considered normal given the recent surgery. No large fluid collection to suggest a biloma or hematoma. Musculoskeletal: No significant bony findings. IMPRESSION: 1. Status post cholecystectomy. Expected postoperative changes as above. No large fluid collection to suggest a biloma or hematoma. 2. Normal caliber and course of the common bile duct. No common bile duct stones are identified. 3. Small amount of free air and fluid/inflammation/edema in the gallbladder fossa and in the periportal region and anterior pararenal space all considered normal given the recent surgery. 4. Small bilateral pleural effusions and streaky bibasilar atelectasis. 5. Moderate gaseous distension of the visualized colon may suggest a postoperative ileus. Electronically Signed   By: MYRTIS Stammer M.D.   On: 10/21/2023 20:12   MR 3D Recon At Scanner Result Date: 10/21/2023 CLINICAL DATA:  Abdominal pain. Patient is 2 days status post cholecystectomy. EXAM: MRI ABDOMEN WITH CONTRAST (WITH MRCP)  TECHNIQUE: Multiplanar multisequence MR imaging of the abdomen was performed following the administration of intravenous contrast. Heavily T2-weighted images of the biliary and pancreatic ducts were obtained, and three-dimensional MRCP images were rendered by post processing. CONTRAST:  7mL GADAVIST  GADOBUTROL  1 MMOL/ML IV SOLN COMPARISON:  Nuclear medicine hepatobiliary scan, same date. FINDINGS: Lower chest: Small bilateral pleural effusions and streaky bibasilar atelectasis. No pericardial effusion. Hepatobiliary: No hepatic lesions or intrahepatic biliary dilatation. Normal caliber and course of the common bile duct. No common bile duct stones are identified. Pancreas:  No mass, inflammation or ductal dilatation. Spleen:  Within normal limits in size and appearance. Adrenals/Urinary Tract:  Adrenal glands and kidneys unremarkable. Stomach/Bowel: The stomach, duodenum, visualized small bowel and colon are unremarkable. Moderate gaseous distension of the visualized colon may suggest a postoperative ileus. No small bowel dilatation. Vascular/Lymphatic: No pathologically enlarged lymph nodes identified. No abdominal aortic aneurysm demonstrated. Other: Small amount of free air is noted and not unexpected this soon after surgery. There is also small amount of fluid/inflammation/edema noted in the gallbladder fossa and in the periportal region and anterior pararenal space all considered normal given the recent surgery. No large fluid collection to suggest a biloma or hematoma. Musculoskeletal: No significant bony findings. IMPRESSION: 1. Status post cholecystectomy. Expected postoperative changes as above. No large fluid collection to suggest a biloma or hematoma. 2. Normal caliber and course of the common bile duct. No common bile duct stones are identified. 3. Small amount of free air and fluid/inflammation/edema in the gallbladder fossa and in the periportal region and anterior pararenal space all considered normal  given the recent surgery. 4. Small bilateral pleural effusions and streaky bibasilar atelectasis. 5. Moderate gaseous distension of the visualized colon may suggest a postoperative ileus. Electronically Signed   By: MYRTIS Stammer M.D.   On: 10/21/2023 20:12   NM HEPATOBILIARY LEAK (POST-SURGICAL) Result Date: 10/21/2023 CLINICAL DATA:  Two days postop from cholecystectomy. Severe right upper quadrant pain. Evaluate for postop bile leak. EXAM: NUCLEAR MEDICINE HEPATOBILIARY IMAGING TECHNIQUE: Sequential images of the abdomen were obtained out to 120 minutes following intravenous administration of radiopharmaceutical. RADIOPHARMACEUTICALS:  7.4 mCi Tc-22m  Choletec  IV COMPARISON:  None Available. FINDINGS: Prompt uptake of activity by the liver is seen. No biliary excretion of radiopharmaceutical activity is seen. This  is suspicious for biliary obstruction, although hepatocellular dysfunction is also a consideration. Given lack of biliary excretion, postop bile leak cannot be excluded. IMPRESSION: Lack of biliary excretion seen, highly suspicious for biliary obstruction, with hepatocellular dysfunction considered less likely. Postop bile leak cannot be excluded on this exam. Electronically Signed   By: Norleen DELENA Kil M.D.   On: 10/21/2023 12:49   CT Angio Chest Pulmonary Embolism (PE) W or WO Contrast Result Date: 10/21/2023 CLINICAL DATA:  Epigastric pain and shortness of breath and nausea. Clinical concern for pulmonary embolus. EXAM: CT ANGIOGRAPHY CHEST WITH CONTRAST TECHNIQUE: Multidetector CT imaging of the chest was performed using the standard protocol during bolus administration of intravenous contrast. Multiplanar CT image reconstructions and MIPs were obtained to evaluate the vascular anatomy. RADIATION DOSE REDUCTION: This exam was performed according to the departmental dose-optimization program which includes automated exposure control, adjustment of the mA and/or kV according to patient size and/or  use of iterative reconstruction technique. CONTRAST:  75mL OMNIPAQUE  IOHEXOL  350 MG/ML SOLN COMPARISON:  07/01/2019 FINDINGS: Cardiovascular: The heart size is normal. No substantial pericardial effusion. No thoracic aortic aneurysm. No substantial atherosclerotic calcification of the thoracic aorta. There is no filling defect within the opacified pulmonary arteries to suggest the presence of an acute pulmonary embolus. Mediastinum/Nodes: No mediastinal lymphadenopathy. There is no hilar lymphadenopathy. The esophagus has normal imaging features. There is no axillary lymphadenopathy. Lungs/Pleura: Calcified granuloma noted right upper lobe. Dependent collapse/consolidation noted in both lung bases, right greater than left. No pulmonary edema or substantial pleural effusion. Upper Abdomen: 13 mm hypervascular lesion is seen in the dome of the liver, not evident on previous chest CTA or abdomen CT of 12/29/2021. Free gas is identified anterior to the liver. Musculoskeletal: No worrisome lytic or sclerotic osseous abnormality. Review of the MIP images confirms the above findings. IMPRESSION: 1. No CT evidence for acute pulmonary embolus. 2. Dependent collapse/consolidation in both lung bases, right greater than left. Imaging features likely reflect dependent atelectasis. 3. Free gas anterior to the liver. This is compatible with the recent surgery on 10/19/2023. 4. 13 mm hypervascular lesion in the dome of the liver, not evident on previous chest CTA or abdomen CT of 12/29/2021. This is probably a benign lesion such as a flash filling hemangioma, FNH or vascular malformation. Follow-up MRI of the abdomen with and without contrast recommended to further evaluate. MRI of the abdomen should be deferred until the patient recovers from acute symptoms and can best participate with positioning and reproducible breath holding. Motion degraded study may well be nondiagnostic. Electronically Signed   By: Camellia Candle M.D.   On:  10/21/2023 09:27   CT ABDOMEN PELVIS W CONTRAST Result Date: 10/21/2023 CLINICAL DATA:  The patient had a laparoscopic cholecystectomy, intraoperative cholangiogram and liver biopsy 2 days ago, now with severe right upper abdominal pain. EXAM: CT ABDOMEN AND PELVIS WITH CONTRAST TECHNIQUE: Multidetector CT imaging of the abdomen and pelvis was performed using the standard protocol following bolus administration of intravenous contrast. RADIATION DOSE REDUCTION: This exam was performed according to the departmental dose-optimization program which includes automated exposure control, adjustment of the mA and/or kV according to patient size and/or use of iterative reconstruction technique. CONTRAST:  OMNIPAQUE  IOHEXOL  300 MG/ML  SOLN COMPARISON:  CTs with IV contrast 12/29/2021 and 08/07/2017. FINDINGS: Lower chest: There are trace pleural effusions. Increased opacity in the posterior base of the lungs right-greater-than-left is most likely due to hypostatic/dependent atelectasis but not seen previously. Underlying infection is  possible. Rest of the lung bases are clear. The cardiac size is normal. Hepatobiliary: Status post cholecystectomy with scattered nonlocalizing fluid and stranding in the gallbladder fossa, probably a normal postoperative finding. Inflammatory process or bile leak not strictly excluded. There is new capsular irregularity over the anterolateral aspect of segment 8. Anterolaterally in segment 8 there is a 4 cm in length linear hypodensity extending from the capsule into the parenchyma on 2:21. This could be the site of the patient's liver biopsy, otherwise, this could be operative trauma to the liver with a linear grade 3 laceration. There is a small volume of overlying low-density perihepatic fluid, but I would expect a larger amount of higher density perihepatic hemorrhage if this was a grade 3 laceration. Correlation with the operative report is recommended. Rest of the liver is  slightly steatotic, and is homogeneous without mass enhancement. Pancreas: No abnormality. Spleen: No abnormality Adrenals/Urinary Tract: No abnormality. Stomach/Bowel: No dilatation or wall thickening including the appendix. Moderate retained stool ascending colon. Advanced sigmoid diverticulosis without acute diverticulitis. Vascular/Lymphatic: No significant vascular findings are present. No enlarged abdominal or pelvic lymph nodes. Reproductive: Prostate is unremarkable. Other: Open wound midline anterior mid abdominal wall, mild underlying stranding. Trace air noted between muscular layers in the anterior right abdominal wall. Small volume of low-density anterior perihepatic fluid, small amount of associated perihepatic intraperitoneal air, suspect residual postoperative change. Free air is not seen elsewhere. There is trace ascites in posterior pelvis. No encapsulated fluid is seen. Small inguinal fat hernias. Musculoskeletal: No acute or significant osseous findings. IMPRESSION: 1. Status post cholecystectomy with scattered nonlocalizing fluid and stranding in the gallbladder fossa, probably a normal postoperative finding. Inflammatory process or bile leak not strictly excluded. 2. 4 cm in length linear hypodensity extending from the capsule into the parenchyma of segment 8 of the liver. This could be the site of the patient's liver biopsy, otherwise, this could be operative trauma to the liver with a linear grade 3 laceration. Correlation with the operative report is recommended. 3. Small volume of low-density perihepatic fluid, small amount of associated perihepatic intraperitoneal air, suspect residual postoperative change. Free air is not seen elsewhere. There is no space-occupying perihepatic or subcapsular intrahepatic hematoma. 4. Trace ascites in the posterior pelvis. No encapsulated fluid is seen. 5. Trace pleural effusions with increased opacity in the posterior base of the lungs  right-greater-than-left, most likely due to hypostatic/dependent atelectasis but not seen previously. Underlying infection is possible. 6. Open wound midline anterior mid abdominal wall with mild underlying stranding. 7. Constipation and diverticulosis. Electronically Signed   By: Francis Quam M.D.   On: 10/21/2023 05:14    Impression/Plan: -Postoperative abdominal pain and jaundice.  Underwent cholecystectomy with IOC and liver biopsy few days ago.  T. bili was normal prior to surgery and now it is 8.2.  HIDA scan showed possible biliary obstruction with no flow.  MRI MRCP shows no biliary obstruction and normal caliber CBD. - Intermittent abdominal pain for few years  Recommendations -------------------------- - Case discussed with surgical team Dr. Aron.  Case also discussed with advanced endoscopist Dr. Wilhelmenia.  No clear indication for ERCP at this time but Dr. Wilhelmenia will proceed with EUS today if endoscopy slots are available. - Keep n.p.o. for now. - Continue other supportive care - GI will follow    LOS: 0 days   Layla Lah  MD, FACP 10/22/2023, 9:07 AM  Contact #  [phone]

## 2023-10-22 NOTE — Progress Notes (Signed)
 Rapid Response Event Note   Reason for Call : pt c/o of abdominal pain   Initial Focused Assessment: Pt abdominal pain has subsided after receiving pain medication ( see MAR) .  Pt A/O and sitting up in bed.   Interventions: Requested Primary RN, reach out to surgery MD and give an update.  Plan of Care: Primary RN to continue to monitor pt and report.  S[place]ia Segler Lavern, RN

## 2023-10-22 NOTE — Plan of Care (Signed)
  Problem: Education: Goal: Knowledge of General Education information will improve Description: Including pain rating scale, medication(s)/side effects and non-pharmacologic comfort measures Outcome: Progressing   Problem: Clinical Measurements: Goal: Ability to maintain clinical measurements within normal limits will improve Outcome: Progressing   Problem: Clinical Measurements: Goal: Will remain free from infection Outcome: Progressing   Problem: Clinical Measurements: Goal: Diagnostic test results will improve Outcome: Progressing   Problem: Coping: Goal: Level of anxiety will decrease Outcome: Progressing   Problem: Skin Integrity: Goal: Risk for impaired skin integrity will decrease Outcome: Progressing   Problem: Pain Managment: Goal: General experience of comfort will improve and/or be controlled Outcome: Progressing

## 2023-10-22 NOTE — Anesthesia Postprocedure Evaluation (Signed)
 Anesthesia Post Note  Patient: Edward Carpenter  Procedure(s) Performed: ULTRASOUND, UPPER GI TRACT, ENDOSCOPIC EGD (ESOPHAGOGASTRODUODENOSCOPY)     Patient location during evaluation: Endoscopy Anesthesia Type: General Level of consciousness: awake and alert Pain management: pain level controlled Vital Signs Assessment: post-procedure vital signs reviewed and stable Respiratory status: spontaneous breathing, nonlabored ventilation and respiratory function stable Cardiovascular status: stable and blood pressure returned to baseline Postop Assessment: no apparent nausea or vomiting Anesthetic complications: no   No notable events documented.                  Carrye Goller

## 2023-10-22 NOTE — Op Note (Signed)
 [facility] Patient Name: Edward Carpenter Procedure Date: 10/22/2023 MRN: [id number] Attending MD: Aloha Finner , MD, [id number] Date of Birth: 1970/05/25 CSN: [id number] Age: 53 Admit Type: Inpatient Procedure:                Upper EUS Indications:              Elevated liver enzymes, Suspected                            choledocholithiasis Providers:                Aloha Finner, MD, Randall Lines, RN, Lorrayne Kitty, Technician Referring MD:              Medicines:                Monitored Anesthesia Care Complications:            No immediate complications. Estimated Blood Loss:     Estimated blood loss was minimal. Procedure:                Pre-Anesthesia Assessment:                           - Prior to the procedure, a History and Physical                            was performed, and patient medications and                            allergies were reviewed. The patient's tolerance of                            previous anesthesia was also reviewed. The risks                            and benefits of the procedure and the sedation                            options and risks were discussed with the patient.                            All questions were answered, and informed consent                            was obtained. Prior Anticoagulants: The patient has                            taken no anticoagulant or antiplatelet agents. ASA                            Grade Assessment: II - A patient with mild systemic                            disease. After reviewing  the risks and benefits,                            the patient was deemed in satisfactory condition to                            undergo the procedure.                           After obtaining informed consent, the endoscope was                            passed under direct vision. Throughout the                            procedure, the patient's blood pressure,  pulse, and                            oxygen saturations were monitored continuously. The                            [id number] ([id number] Olympus endosonoscope was                            introduced through the mouth, and advanced to the                            duodenum for ultrasound examination from the                            stomach and duodenum. The [id number]-[id number] ([id number])                            Olympus endoscope was introduced through the mouth,                            and advanced to the second part of duodenum. The                            upper EUS was accomplished without difficulty. The                            patient tolerated the procedure. Scope In: Scope Out: Findings:      ENDOSCOPIC FINDING: :      No gross lesions were noted in the entire esophagus.      The Z-line was irregular and was found 40 cm from the incisors.      Patchy mildly erythematous mucosa without bleeding was found in the       entire examined stomach. Biopsies were taken with a cold forceps for       histology and Helicobacter pylori testing.      No gross lesions were noted in the duodenal bulb, in the first portion       of the duodenum and in the second portion of the duodenum.      The major  papilla was normal.      ENDOSONOGRAPHIC FINDING: :      Moderate hyperechoic material consistent with sludge/sludge stone was       visualized endosonographically in the common bile duct. CBD measured 3.1       -> 4.5 mm. The CHD measured 5.0 mm.      Pancreatic parenchymal abnormalities were noted in the entire pancreas.       These consisted of hyperechoic strands. No evidence of any mass or       lesion was noted.      The pancreatic duct was of normal size. The diameter of the main       pancreatic duct (MPD) measured:      - HOP 1.6 -> 2.3 mm (head of pancreas)      - NOP 1.7 mm (negative pancreas)      - BOP 0.8 mm (body of the pancreas)      - TOP 0.5 mm (tail of the pancreas).       Endosonographic imaging of the ampulla showed no intramural       (subepithelial) lesion.      No malignant-appearing lymph nodes were visualized in the celiac region       (level 20), peripancreatic region and porta hepatis region.      Endosonographic imaging in the visualized portion of the liver showed no       mass.      The celiac region was visualized. Impression:               EGD impression:                           - No gross lesions in the entire esophagus. Z-line                            irregular, 40 cm from the incisors.                           - Erythematous mucosa in the stomach. Biopsied.                           - No gross lesions in the duodenal bulb, in the                            first portion of the duodenum and in the second                            portion of the duodenum.                           - Normal major papilla.                           EUS impression:                           -Moderate amount of hyperechoic material consistent                            with sludge/sludge stone was visualized  endosonographically in the common bile duct. The                            bile duct itself was not dilated however.                           - Pancreatic parenchymal abnormalities consisting                            of hyperechoic strands were noted in the entire                            pancreas. No evidence of any mass or lesion was                            noted. Main pancreatic duct (MPD) diameter was                            measured. Endosonographically, the MPD had a normal                            appearance.                           - No malignant-appearing lymph nodes were                            visualized in the celiac region (level 20),                            peripancreatic region and porta hepatis region. Moderate Sedation:      Not Applicable - Patient had care per  Anesthesia. Recommendation:           - The patient will be observed post-procedure,                            until all discharge criteria are met.                           - Return patient to hospital ward for ongoing care.                           - Resume previous diet.                           - Observe patient's clinical course. Degree of                            abdominal pain seems out of proportion to the                            biliary duct dilation and sludge noted within the  biliary tree.                           - Check liver enzymes (AST, ALT, alkaline                            phosphatase, bilirubin) in the morning.                           - N.p.o. at midnight if ERCP possible on Monday.                           - ERCP as per [facility] GI scheduling.                           - Follow-up pathology.                           - The findings and recommendations were discussed                            with the patient.                           - The findings and recommendations were discussed                            with the referring physician. Procedure Code(s):        --- Professional ---                           [phone], Esophagogastroduodenoscopy, flexible,                            transoral; with endoscopic ultrasound examination                            limited to the esophagus, stomach or duodenum, and                            adjacent structures                           43239, Esophagogastroduodenoscopy, flexible,                            transoral; with biopsy, single or multiple Diagnosis Code(s):        --- Professional ---                           K22.89, Other specified disease of esophagus                           K31.89, Other diseases of stomach and duodenum                           K86.9, Disease of pancreas, unspecified  I89.9, Noninfective disorder of lymphatic vessels                             and lymph nodes, unspecified                           R74.8, Abnormal levels of other serum enzymes                           K83.8, Other specified diseases of biliary tract CPT copyright 2022 [employer]. All rights reserved. The codes documented in this report are preliminary and upon coder review may  be revised to meet current compliance requirements. Aloha Finner, MD 10/22/2023 1:00:41 PM Number of Addenda: 0

## 2023-10-22 NOTE — Progress Notes (Addendum)
 2213: Scheduled stool softener given with small sip of water. Immediately, the patient began screaming that his abdomen was cramping violently. He repeatedly stated he was unable to breathe d/t the cramping. Respirations were regular and shallow. The patient became very diaphoretic and appeared very distressed.  2220: Robaxin  500 mg IV given. Abd firm and taut. Pain still 10/10.  2222: Dilaudid  1mg  IV given. Pt sitting on the side of the bed. VS obtained (see VS flowsheet).  Telemetry show ST without any abnormalities. [id number] Katherin Battiest, RR RN notified of patient condition and my concerns. Genell at pt bedside. Pain subsiding.  2300: CCS paged x 2. No call back received.  The patient and his wife stated this was the 4th episode over the past 24 hours that came on suddenly with excruciating pain/cramping, made him struggle to take full breaths, and made him diaphoretic.

## 2023-10-22 NOTE — Transfer of Care (Signed)
 Immediate Anesthesia Transfer of Care Note  Patient: Edward Carpenter  Procedure(s) Performed: ULTRASOUND, UPPER GI TRACT, ENDOSCOPIC EGD (ESOPHAGOGASTRODUODENOSCOPY)  Patient Location: PACU  Anesthesia Type:MAC  Level of Consciousness: drowsy and patient cooperative  Airway & Oxygen Therapy: Patient Spontanous Breathing  Post-op Assessment: Report given to RN and Post -op Vital signs reviewed and stable  Post vital signs: Reviewed and stable  Last Vitals:  Vitals Value Taken Time  BP 114/71 10/22/23 12:58  Temp    Pulse 105 10/22/23 13:00  Resp 20 10/22/23 13:00  SpO2 96 % 10/22/23 13:00  Vitals shown include unfiled device data.  Last Pain:  Vitals:   10/22/23 1156  TempSrc: Temporal  PainSc: 7       Patients Stated Pain Goal: 7 (10/22/23 1156)  Complications: No notable events documented.

## 2023-10-22 NOTE — Progress Notes (Signed)
 Subjective: Pt complains of intermittent severe substernal pain and back pain.  PE and MI ruled out.    Objective: Vital signs in last 24 hours: Temp:  [98.4 F (36.9 C)-99.6 F (37.6 C)] 98.7 F (37.1 C) (09/21 0544) Pulse Rate:  [94-115] 96 (09/21 0544) Resp:  [15-19] 18 (09/21 0544) BP: (129-152)/(91-115) 133/94 (09/21 0544) SpO2:  [93 %-100 %] 95 % (09/21 0544)   Intake/Output from previous day: 09/20 0701 - 09/21 0700 In: 2046.9 [I.V.:1046.9; IV Piggyback:1000] Out: -  Intake/Output this shift: No intake/output data recorded.   General appearance: alert and cooperative GI: non-distended, mild TTP  Incision: no significant drainage  Lab Results:  Recent Labs    10/21/23 0335 10/21/23 0343 10/22/23 0354  WBC 11.8*  --  6.7  HGB 15.1 15.0 13.5  HCT 45.4 44.0 43.6  PLT 291  --  260   BMET Recent Labs    10/21/23 0335 10/21/23 0343 10/22/23 0354  NA 138 137 138  K 3.9 4.1 5.0  CL 101 100 101  CO2 24  --  26  GLUCOSE 112* 109* 114*  BUN 12 13 12   CREATININE 0.80 1.00 0.97  CALCIUM  9.6  --  9.3   PT/INR No results for input(s): LABPROT, INR in the last 72 hours. ABG No results for input(s): PHART, HCO3 in the last 72 hours.  Invalid input(s): PCO2, PO2  MEDS, Scheduled  acetaminophen   1,000 mg Oral Q6H   enoxaparin  (LOVENOX ) injection  40 mg Subcutaneous Q24H   polyethylene glycol  17 g Oral Daily   senna  1 tablet Oral BID    Studies/Results: MR ABDOMEN WITH MRCP W CONTRAST Result Date: 10/21/2023 CLINICAL DATA:  Abdominal pain. Patient is 2 days status post cholecystectomy. EXAM: MRI ABDOMEN WITH CONTRAST (WITH MRCP) TECHNIQUE: Multiplanar multisequence MR imaging of the abdomen was performed following the administration of intravenous contrast. Heavily T2-weighted images of the biliary and pancreatic ducts were obtained, and three-dimensional MRCP images were rendered by post processing. CONTRAST:  7mL GADAVIST  GADOBUTROL  1  MMOL/ML IV SOLN COMPARISON:  Nuclear medicine hepatobiliary scan, same date. FINDINGS: Lower chest: Small bilateral pleural effusions and streaky bibasilar atelectasis. No pericardial effusion. Hepatobiliary: No hepatic lesions or intrahepatic biliary dilatation. Normal caliber and course of the common bile duct. No common bile duct stones are identified. Pancreas:  No mass, inflammation or ductal dilatation. Spleen:  Within normal limits in size and appearance. Adrenals/Urinary Tract:  Adrenal glands and kidneys unremarkable. Stomach/Bowel: The stomach, duodenum, visualized small bowel and colon are unremarkable. Moderate gaseous distension of the visualized colon may suggest a postoperative ileus. No small bowel dilatation. Vascular/Lymphatic: No pathologically enlarged lymph nodes identified. No abdominal aortic aneurysm demonstrated. Other: Small amount of free air is noted and not unexpected this soon after surgery. There is also small amount of fluid/inflammation/edema noted in the gallbladder fossa and in the periportal region and anterior pararenal space all considered normal given the recent surgery. No large fluid collection to suggest a biloma or hematoma. Musculoskeletal: No significant bony findings. IMPRESSION: 1. Status post cholecystectomy. Expected postoperative changes as above. No large fluid collection to suggest a biloma or hematoma. 2. Normal caliber and course of the common bile duct. No common bile duct stones are identified. 3. Small amount of free air and fluid/inflammation/edema in the gallbladder fossa and in the periportal region and anterior pararenal space all considered normal given the recent surgery. 4. Small bilateral pleural effusions and streaky bibasilar atelectasis. 5. Moderate gaseous distension  of the visualized colon may suggest a postoperative ileus. Electronically Signed   By: MYRTIS Stammer M.D.   On: 10/21/2023 20:12   MR 3D Recon At Scanner Result Date:  10/21/2023 CLINICAL DATA:  Abdominal pain. Patient is 2 days status post cholecystectomy. EXAM: MRI ABDOMEN WITH CONTRAST (WITH MRCP) TECHNIQUE: Multiplanar multisequence MR imaging of the abdomen was performed following the administration of intravenous contrast. Heavily T2-weighted images of the biliary and pancreatic ducts were obtained, and three-dimensional MRCP images were rendered by post processing. CONTRAST:  7mL GADAVIST  GADOBUTROL  1 MMOL/ML IV SOLN COMPARISON:  Nuclear medicine hepatobiliary scan, same date. FINDINGS: Lower chest: Small bilateral pleural effusions and streaky bibasilar atelectasis. No pericardial effusion. Hepatobiliary: No hepatic lesions or intrahepatic biliary dilatation. Normal caliber and course of the common bile duct. No common bile duct stones are identified. Pancreas:  No mass, inflammation or ductal dilatation. Spleen:  Within normal limits in size and appearance. Adrenals/Urinary Tract:  Adrenal glands and kidneys unremarkable. Stomach/Bowel: The stomach, duodenum, visualized small bowel and colon are unremarkable. Moderate gaseous distension of the visualized colon may suggest a postoperative ileus. No small bowel dilatation. Vascular/Lymphatic: No pathologically enlarged lymph nodes identified. No abdominal aortic aneurysm demonstrated. Other: Small amount of free air is noted and not unexpected this soon after surgery. There is also small amount of fluid/inflammation/edema noted in the gallbladder fossa and in the periportal region and anterior pararenal space all considered normal given the recent surgery. No large fluid collection to suggest a biloma or hematoma. Musculoskeletal: No significant bony findings. IMPRESSION: 1. Status post cholecystectomy. Expected postoperative changes as above. No large fluid collection to suggest a biloma or hematoma. 2. Normal caliber and course of the common bile duct. No common bile duct stones are identified. 3. Small amount of free air  and fluid/inflammation/edema in the gallbladder fossa and in the periportal region and anterior pararenal space all considered normal given the recent surgery. 4. Small bilateral pleural effusions and streaky bibasilar atelectasis. 5. Moderate gaseous distension of the visualized colon may suggest a postoperative ileus. Electronically Signed   By: MYRTIS Stammer M.D.   On: 10/21/2023 20:12   NM HEPATOBILIARY LEAK (POST-SURGICAL) Result Date: 10/21/2023 CLINICAL DATA:  Two days postop from cholecystectomy. Severe right upper quadrant pain. Evaluate for postop bile leak. EXAM: NUCLEAR MEDICINE HEPATOBILIARY IMAGING TECHNIQUE: Sequential images of the abdomen were obtained out to 120 minutes following intravenous administration of radiopharmaceutical. RADIOPHARMACEUTICALS:  7.4 mCi Tc-10m  Choletec  IV COMPARISON:  None Available. FINDINGS: Prompt uptake of activity by the liver is seen. No biliary excretion of radiopharmaceutical activity is seen. This is suspicious for biliary obstruction, although hepatocellular dysfunction is also a consideration. Given lack of biliary excretion, postop bile leak cannot be excluded. IMPRESSION: Lack of biliary excretion seen, highly suspicious for biliary obstruction, with hepatocellular dysfunction considered less likely. Postop bile leak cannot be excluded on this exam. Electronically Signed   By: Norleen DELENA Kil M.D.   On: 10/21/2023 12:49   CT Angio Chest Pulmonary Embolism (PE) W or WO Contrast Result Date: 10/21/2023 CLINICAL DATA:  Epigastric pain and shortness of breath and nausea. Clinical concern for pulmonary embolus. EXAM: CT ANGIOGRAPHY CHEST WITH CONTRAST TECHNIQUE: Multidetector CT imaging of the chest was performed using the standard protocol during bolus administration of intravenous contrast. Multiplanar CT image reconstructions and MIPs were obtained to evaluate the vascular anatomy. RADIATION DOSE REDUCTION: This exam was performed according to the departmental  dose-optimization program which includes automated exposure control, adjustment  of the mA and/or kV according to patient size and/or use of iterative reconstruction technique. CONTRAST:  75mL OMNIPAQUE  IOHEXOL  350 MG/ML SOLN COMPARISON:  07/01/2019 FINDINGS: Cardiovascular: The heart size is normal. No substantial pericardial effusion. No thoracic aortic aneurysm. No substantial atherosclerotic calcification of the thoracic aorta. There is no filling defect within the opacified pulmonary arteries to suggest the presence of an acute pulmonary embolus. Mediastinum/Nodes: No mediastinal lymphadenopathy. There is no hilar lymphadenopathy. The esophagus has normal imaging features. There is no axillary lymphadenopathy. Lungs/Pleura: Calcified granuloma noted right upper lobe. Dependent collapse/consolidation noted in both lung bases, right greater than left. No pulmonary edema or substantial pleural effusion. Upper Abdomen: 13 mm hypervascular lesion is seen in the dome of the liver, not evident on previous chest CTA or abdomen CT of 12/29/2021. Free gas is identified anterior to the liver. Musculoskeletal: No worrisome lytic or sclerotic osseous abnormality. Review of the MIP images confirms the above findings. IMPRESSION: 1. No CT evidence for acute pulmonary embolus. 2. Dependent collapse/consolidation in both lung bases, right greater than left. Imaging features likely reflect dependent atelectasis. 3. Free gas anterior to the liver. This is compatible with the recent surgery on 10/19/2023. 4. 13 mm hypervascular lesion in the dome of the liver, not evident on previous chest CTA or abdomen CT of 12/29/2021. This is probably a benign lesion such as a flash filling hemangioma, FNH or vascular malformation. Follow-up MRI of the abdomen with and without contrast recommended to further evaluate. MRI of the abdomen should be deferred until the patient recovers from acute symptoms and can best participate with positioning  and reproducible breath holding. Motion degraded study may well be nondiagnostic. Electronically Signed   By: Camellia Candle M.D.   On: 10/21/2023 09:27   CT ABDOMEN PELVIS W CONTRAST Result Date: 10/21/2023 CLINICAL DATA:  The patient had a laparoscopic cholecystectomy, intraoperative cholangiogram and liver biopsy 2 days ago, now with severe right upper abdominal pain. EXAM: CT ABDOMEN AND PELVIS WITH CONTRAST TECHNIQUE: Multidetector CT imaging of the abdomen and pelvis was performed using the standard protocol following bolus administration of intravenous contrast. RADIATION DOSE REDUCTION: This exam was performed according to the departmental dose-optimization program which includes automated exposure control, adjustment of the mA and/or kV according to patient size and/or use of iterative reconstruction technique. CONTRAST:  OMNIPAQUE  IOHEXOL  300 MG/ML  SOLN COMPARISON:  CTs with IV contrast 12/29/2021 and 08/07/2017. FINDINGS: Lower chest: There are trace pleural effusions. Increased opacity in the posterior base of the lungs right-greater-than-left is most likely due to hypostatic/dependent atelectasis but not seen previously. Underlying infection is possible. Rest of the lung bases are clear. The cardiac size is normal. Hepatobiliary: Status post cholecystectomy with scattered nonlocalizing fluid and stranding in the gallbladder fossa, probably a normal postoperative finding. Inflammatory process or bile leak not strictly excluded. There is new capsular irregularity over the anterolateral aspect of segment 8. Anterolaterally in segment 8 there is a 4 cm in length linear hypodensity extending from the capsule into the parenchyma on 2:21. This could be the site of the patient's liver biopsy, otherwise, this could be operative trauma to the liver with a linear grade 3 laceration. There is a small volume of overlying low-density perihepatic fluid, but I would expect a larger amount of higher density  perihepatic hemorrhage if this was a grade 3 laceration. Correlation with the operative report is recommended. Rest of the liver is slightly steatotic, and is homogeneous without mass enhancement. Pancreas: No abnormality.  Spleen: No abnormality Adrenals/Urinary Tract: No abnormality. Stomach/Bowel: No dilatation or wall thickening including the appendix. Moderate retained stool ascending colon. Advanced sigmoid diverticulosis without acute diverticulitis. Vascular/Lymphatic: No significant vascular findings are present. No enlarged abdominal or pelvic lymph nodes. Reproductive: Prostate is unremarkable. Other: Open wound midline anterior mid abdominal wall, mild underlying stranding. Trace air noted between muscular layers in the anterior right abdominal wall. Small volume of low-density anterior perihepatic fluid, small amount of associated perihepatic intraperitoneal air, suspect residual postoperative change. Free air is not seen elsewhere. There is trace ascites in posterior pelvis. No encapsulated fluid is seen. Small inguinal fat hernias. Musculoskeletal: No acute or significant osseous findings. IMPRESSION: 1. Status post cholecystectomy with scattered nonlocalizing fluid and stranding in the gallbladder fossa, probably a normal postoperative finding. Inflammatory process or bile leak not strictly excluded. 2. 4 cm in length linear hypodensity extending from the capsule into the parenchyma of segment 8 of the liver. This could be the site of the patient's liver biopsy, otherwise, this could be operative trauma to the liver with a linear grade 3 laceration. Correlation with the operative report is recommended. 3. Small volume of low-density perihepatic fluid, small amount of associated perihepatic intraperitoneal air, suspect residual postoperative change. Free air is not seen elsewhere. There is no space-occupying perihepatic or subcapsular intrahepatic hematoma. 4. Trace ascites in the posterior pelvis. No  encapsulated fluid is seen. 5. Trace pleural effusions with increased opacity in the posterior base of the lungs right-greater-than-left, most likely due to hypostatic/dependent atelectasis but not seen previously. Underlying infection is possible. 6. Open wound midline anterior mid abdominal wall with mild underlying stranding. 7. Constipation and diverticulosis. Electronically Signed   By: Francis Quam M.D.   On: 10/21/2023 05:14    Assessment: s/p  Patient Active Problem List   Diagnosis Date Noted   Elevated LFTs 10/21/2023   Chronic GERD 10/19/2023   Diverticulitis of colon 10/19/2023   Chronic cholecystitis 10/19/2023   Hepatic steatosis 10/19/2023   Biliary dyskinesia 08/21/2023   Anxiety 06/03/2022   Irritable bowel syndrome 04/05/2004    HIDA and MRCP results reviewed.  HIDA is concerning for biliary obstruction but nothing seen on MRCP.  Bilirubin up to 8 today.    Plan: We have consulted GI to eval pt's jaundice.  Updated patient and SO on plan.  All questions answered.   LOS: 0 days     .Bernarda JAYSON Ned, MD [facility], [place]    10/22/2023 8:13 AM

## 2023-10-23 ENCOUNTER — Encounter (HOSPITAL_COMMUNITY): Payer: Self-pay | Admitting: Surgery

## 2023-10-23 DIAGNOSIS — F419 Anxiety disorder, unspecified: Secondary | ICD-10-CM | POA: Diagnosis present

## 2023-10-23 DIAGNOSIS — K8051 Calculus of bile duct without cholangitis or cholecystitis with obstruction: Secondary | ICD-10-CM | POA: Diagnosis present

## 2023-10-23 DIAGNOSIS — Z8249 Family history of ischemic heart disease and other diseases of the circulatory system: Secondary | ICD-10-CM | POA: Diagnosis not present

## 2023-10-23 DIAGNOSIS — R945 Abnormal results of liver function studies: Secondary | ICD-10-CM | POA: Diagnosis not present

## 2023-10-23 DIAGNOSIS — K2950 Unspecified chronic gastritis without bleeding: Secondary | ICD-10-CM | POA: Diagnosis present

## 2023-10-23 DIAGNOSIS — R1013 Epigastric pain: Secondary | ICD-10-CM | POA: Diagnosis present

## 2023-10-23 DIAGNOSIS — Z5948 Other specified lack of adequate food: Secondary | ICD-10-CM | POA: Diagnosis not present

## 2023-10-23 DIAGNOSIS — R17 Unspecified jaundice: Secondary | ICD-10-CM | POA: Diagnosis not present

## 2023-10-23 DIAGNOSIS — K9186 Retained cholelithiasis following cholecystectomy: Secondary | ICD-10-CM | POA: Diagnosis present

## 2023-10-23 DIAGNOSIS — K219 Gastro-esophageal reflux disease without esophagitis: Secondary | ICD-10-CM | POA: Diagnosis present

## 2023-10-23 DIAGNOSIS — K8050 Calculus of bile duct without cholangitis or cholecystitis without obstruction: Secondary | ICD-10-CM | POA: Diagnosis not present

## 2023-10-23 DIAGNOSIS — Z888 Allergy status to other drugs, medicaments and biological substances status: Secondary | ICD-10-CM | POA: Diagnosis not present

## 2023-10-23 DIAGNOSIS — Z823 Family history of stroke: Secondary | ICD-10-CM | POA: Diagnosis not present

## 2023-10-23 DIAGNOSIS — Z5941 Food insecurity: Secondary | ICD-10-CM | POA: Diagnosis not present

## 2023-10-23 DIAGNOSIS — R932 Abnormal findings on diagnostic imaging of liver and biliary tract: Secondary | ICD-10-CM | POA: Diagnosis not present

## 2023-10-23 DIAGNOSIS — K7581 Nonalcoholic steatohepatitis (NASH): Secondary | ICD-10-CM | POA: Diagnosis present

## 2023-10-23 LAB — PREALBUMIN: Prealbumin: 14 mg/dL — ABNORMAL LOW (ref 18–38)

## 2023-10-23 LAB — PHOSPHORUS: Phosphorus: 2.2 mg/dL — ABNORMAL LOW (ref 2.5–4.6)

## 2023-10-23 MED ORDER — LACTATED RINGERS IV BOLUS
1000.0000 mL | Freq: Three times a day (TID) | INTRAVENOUS | Status: AC | PRN
Start: 2023-10-23 — End: 2023-10-25

## 2023-10-23 MED ORDER — MAGIC MOUTHWASH: 15.0000 mL | Freq: Four times a day (QID) | ORAL | Status: DC | PRN

## 2023-10-23 MED ORDER — PHENOL 1.4 % MT LIQD: 2.0000 | OROMUCOSAL | Status: DC | PRN

## 2023-10-23 MED ORDER — METHOCARBAMOL 1000 MG/10ML IJ SOLN
1000.0000 mg | Freq: Four times a day (QID) | INTRAMUSCULAR | Status: DC | PRN
  Administered 2023-10-23 (×2): 1000 mg via INTRAVENOUS
  Filled 2023-10-23 (×2): qty 10

## 2023-10-23 MED ORDER — CLONAZEPAM 0.5 MG PO TABS
0.5000 mg | ORAL_TABLET | Freq: Three times a day (TID) | ORAL | Status: DC | PRN
Start: 2023-10-23 — End: 2023-10-23

## 2023-10-23 MED ORDER — OXYCODONE HCL 5 MG PO TABS
5.0000 mg | ORAL_TABLET | ORAL | Status: DC | PRN
  Administered 2023-10-23 – 2023-10-24 (×3): 10 mg via ORAL
  Filled 2023-10-23: qty 2
  Filled 2023-10-23: qty 1
  Filled 2023-10-23 (×2): qty 2

## 2023-10-23 MED ORDER — SODIUM CHLORIDE 0.9 % IV SOLN
2.0000 g | INTRAVENOUS | Status: DC
  Administered 2023-10-23 – 2023-10-25 (×3): 2 g via INTRAVENOUS
  Filled 2023-10-23 (×3): qty 20

## 2023-10-23 MED ORDER — BISACODYL 10 MG RE SUPP: 10.0000 mg | Freq: Two times a day (BID) | RECTAL | Status: DC | PRN

## 2023-10-23 MED ORDER — LACTATED RINGERS IV BOLUS
1000.0000 mL | Freq: Once | INTRAVENOUS | Status: AC
Start: 2023-10-23 — End: 2023-10-23
  Administered 2023-10-23: 1000 mL via INTRAVENOUS

## 2023-10-23 MED ORDER — ACETAMINOPHEN 325 MG PO TABS: 325.0000 mg | ORAL_TABLET | Freq: Four times a day (QID) | ORAL | Status: DC | PRN

## 2023-10-23 MED ORDER — DIPHENHYDRAMINE HCL 50 MG/ML IJ SOLN: 12.5000 mg | Freq: Four times a day (QID) | INTRAMUSCULAR | Status: DC | PRN

## 2023-10-23 MED ORDER — ACETAMINOPHEN 500 MG PO TABS
1000.0000 mg | ORAL_TABLET | Freq: Four times a day (QID) | ORAL | Status: DC
  Administered 2023-10-24 – 2023-10-25 (×4): 1000 mg via ORAL
  Filled 2023-10-23 (×5): qty 2

## 2023-10-23 MED ORDER — CALCIUM POLYCARBOPHIL 625 MG PO TABS
625.0000 mg | ORAL_TABLET | Freq: Two times a day (BID) | ORAL | Status: DC
  Administered 2023-10-23 – 2023-10-25 (×4): 625 mg via ORAL
  Filled 2023-10-23 (×5): qty 1

## 2023-10-23 MED ORDER — HYDROMORPHONE HCL 1 MG/ML IJ SOLN
0.5000 mg | INTRAMUSCULAR | Status: DC | PRN
  Administered 2023-10-23 (×3): 1 mg via INTRAVENOUS
  Filled 2023-10-23 (×2): qty 1

## 2023-10-23 MED ORDER — PROCHLORPERAZINE EDISYLATE 10 MG/2ML IJ SOLN
5.0000 mg | INTRAMUSCULAR | Status: DC | PRN
  Administered 2023-10-23: 10 mg via INTRAVENOUS
  Filled 2023-10-23: qty 2

## 2023-10-23 MED ORDER — METOPROLOL TARTRATE 5 MG/5ML IV SOLN: 5.0000 mg | Freq: Four times a day (QID) | INTRAVENOUS | Status: DC | PRN

## 2023-10-23 MED ORDER — NAPHAZOLINE-GLYCERIN 0.012-0.25 % OP SOLN: 1.0000 [drp] | Freq: Four times a day (QID) | OPHTHALMIC | Status: DC | PRN

## 2023-10-23 MED ORDER — MENTHOL 3 MG MT LOZG: 1.0000 | LOZENGE | OROMUCOSAL | Status: DC | PRN

## 2023-10-23 MED ORDER — CLONAZEPAM 0.5 MG PO TABS
0.5000 mg | ORAL_TABLET | Freq: Three times a day (TID) | ORAL | Status: DC | PRN
  Administered 2023-10-23 – 2023-10-24 (×2): 0.5 mg via ORAL
  Filled 2023-10-23 (×2): qty 1
  Filled 2023-10-23: qty 2

## 2023-10-23 MED ORDER — HYDROMORPHONE HCL 1 MG/ML IJ SOLN
0.5000 mg | INTRAMUSCULAR | Status: DC | PRN
  Administered 2023-10-23 (×2): 0.5 mg via INTRAVENOUS
  Administered 2023-10-23: 1 mg via INTRAVENOUS
  Administered 2023-10-23: 0.5 mg via INTRAVENOUS
  Administered 2023-10-24 (×2): 1 mg via INTRAVENOUS
  Filled 2023-10-23 (×6): qty 1

## 2023-10-23 MED ORDER — SALINE SPRAY 0.65 % NA SOLN
1.0000 | Freq: Four times a day (QID) | NASAL | Status: DC | PRN
Start: 2023-10-23 — End: 2023-10-25

## 2023-10-23 MED ORDER — METHOCARBAMOL 500 MG PO TABS: 1000.0000 mg | ORAL_TABLET | Freq: Four times a day (QID) | ORAL | Status: DC | PRN

## 2023-10-23 MED ORDER — KETOROLAC TROMETHAMINE 15 MG/ML IJ SOLN
15.0000 mg | Freq: Once | INTRAMUSCULAR | Status: DC
  Filled 2023-10-23: qty 1

## 2023-10-23 MED ORDER — ALUM & MAG HYDROXIDE-SIMETH 200-200-20 MG/5ML PO SUSP
30.0000 mL | Freq: Four times a day (QID) | ORAL | Status: DC | PRN
  Filled 2023-10-23: qty 30

## 2023-10-23 MED ORDER — PANTOPRAZOLE SODIUM 40 MG PO TBEC
40.0000 mg | DELAYED_RELEASE_TABLET | Freq: Two times a day (BID) | ORAL | Status: DC
  Administered 2023-10-23 – 2023-10-25 (×4): 40 mg via ORAL
  Filled 2023-10-23 (×4): qty 1

## 2023-10-23 MED ORDER — SIMETHICONE 40 MG/0.6ML PO SUSP: 80.0000 mg | Freq: Four times a day (QID) | ORAL | Status: DC | PRN

## 2023-10-23 MED ORDER — ACETAMINOPHEN 650 MG RE SUPP: 650.0000 mg | Freq: Four times a day (QID) | RECTAL | Status: DC | PRN

## 2023-10-23 NOTE — Progress Notes (Signed)
 Pt continues to complain of pain 10/10 after 1mg  PRN Dilaudid  had been given. Dr. Ebbie notified and RN received order to give PRN oxycodone  now.

## 2023-10-23 NOTE — Progress Notes (Addendum)
 10/23/2023  Edward Carpenter [id number] [birth year]  CARE TEAM: PCP: Kathrene Mardy HERO, PA-C  Outpatient Care Team: Patient Care Team: Allwardt, Mardy HERO RIGGERS as PCP - General (Physician Assistant) Sheldon Standing, MD as Consulting Physician (General Surgery) Elicia Claw, MD as Consulting Physician (Gastroenterology)  Inpatient Treatment Team: Treatment Team:  Sheldon Standing, MD Ccs, Md, MD Elicia Claw, MD Zamudio-Aguilar, Dianna G, [place] Estelle Hunter DEL, RN Kristy Justine DELENA, RN Casimir Camelia RAMAN, [place]   Problem List:   Principal Problem:   Elevated LFTs Active Problems:   Epigastric pain   Transaminitis   Biliary sludge   Choledocholithiasis  10/19/2023  POST-OPERATIVE DIAGNOSIS:  Chronic Cholecystitis   PROCEDURE:  Core Liver Biopsy (CPT code 52998) & SINGLE SITE Laparoscopic cholecystectomy with intraoperative cholangiogram (CPT code 52436)   SURGEON:  Standing KYM Sheldon, MD, FACS.   OR FINDINGS:  Gallbladder with some grey changes, moderately dense omental adhesions, and thickening; suspicious for chronic cholecystitis.  Cholangiogram without any evidence of any obstruction or concern.  Rather normal biliary anatomy.   Liver: Mild steatosis.  Core liver biopsies done to rule out steatohepatitis or other concerns.  FINAL MICROSCOPIC DIAGNOSIS:  A. GALLBLADDER, CHOLECYSTECTOMY:      Chronic cholecystitis.  B. LIVER, NEEDLE CORE BIOPSY: Steatohepatitis with no fibrosis (Kleiner stage 0 / 4).    10/22/2023  Procedure(s): ULTRASOUND, UPPER GI TRACT, ENDOSCOPIC EGD (ESOPHAGOGASTRODUODENOSCOPY)    Assessment [facility] Stay = 0 days) 1 Day Post-Op    Worsening jaundice presumably due to sludge in common bile duct    Plan:  Atypical situation in a patient that presumably had no gallstones and just hyperkinesis/chronic acalculous cholecystitis.  Underwhelming intraoperative cholangiogram.  Underwhelming liver biopsies.  However he has  increasing liver function test.  HIDA scan shows no obvious bile leak to my view and endoscopic ultrasound done yesterday shows no bile duct injury.  Some distal choledocholithiasis but caliber not increased.  His bilirubin has shot up though.  I do think he needs an ERCP with sphincterotomy and possible stenting.  He is tentatively put on for tomorrow, Tuesday 9/23.  Sent a message to [facility] to see if he can be moved up sooner but apparently slots are tight today.    Bowel rest  Nausea and pain control.  Anxiolysis.  Will start IV antibiotics.  Ceftriaxone  for now.  Given epigastric discomfort, will start PPIs as well.  Some evidence of some constipation.  Bowel regimen p.o. and suppositories.  -monitor electrolytes & replace as needed  Keep K>4, Mg>2, Phos>3  -VTE prophylaxis- SCDs.  Anticoagulation prophyllaxis SQ as appropriate  -mobilize as tolerated to help recovery.  Enlist therapies in moderate/high risk patients as appropriate  I updated the patient's status to the patient and his wife at bedside.  Patient is tearful but consolable.  Wife is Adult nurse.  Discussed with nursing and just outside room as well.  I reached out to Dr. Elicia wih [facility].  He cannot do the ERCP, but it is on the schedule to do in the next 24 hours.  Recommendations were made.  Questions were answered.  They expressed understanding & appreciation.  -Disposition:  Disposition:  The patient is from: Home Anticipate discharge to:  Home Anticipated Date of Discharge is:  September 24,2025   Barriers to discharge:  Pending Clinical improvement (more likely than not), Therapy assessment & Recommendations pending, Need for inpatient procedure/study, Testing result pending, and Consultant clearance & sign off    Patient currently is  NOT MEDICALLY STABLE for discharge from the hospital from a surgery standpoint.      I reviewed nursing notes, ED provider notes, Consultant GI notes, last 24 h  vitals and pain scores, last 48 h intake and output, last 24 h labs and trends, and last 24 h imaging results.  I have reviewed this patient's available data, including medical history, events of note, test results, etc as part of my evaluation.   A significant portion of that time was spent in counseling. Care during the described time interval was provided by me.  This care required moderate level of medical decision making.  10/23/2023    Subjective: (Chief complaint)  Patient with epigastric pain.  Worried.  Wife at bedside.  Nursing just outside room.  Nauseated but no vomiting.  Urine is clearly darker today.  Objective:  Vital signs:  Vitals:   10/22/23 1836 10/22/23 2138 10/23/23 0201 10/23/23 0558  BP: (!) 134/94 (!) 143/79 (!) 138/95 (!) 142/96  Pulse: (!) 112 (!) 108 (!) 103 98  Resp: 18 18 16 18   Temp: 99.9 F (37.7 C) 99.5 F (37.5 C) 98.8 F (37.1 C) 98.8 F (37.1 C)  TempSrc: Oral Oral Oral   SpO2: 98% 96% 98% 99%  Weight:      Height:        Last BM Date : 10/18/23  Intake/Output   Yesterday:  09/21 0701 - 09/22 0700 In: 1524.2 [P.O.:240; I.V.:1284.2] Out: -  This shift:  No intake/output data recorded.  Bowel function:  Flatus: YES  BM:  No  Drain: (No drain)   Physical Exam:  General: Pt awake/alert in mild acute distress.  Jaundiced Eyes: PERRL, normal EOM.  Sclera clear.  Some icterus Neuro: CN II-XII intact w/o focal sensory/motor deficits. Lymph: No head/neck/groin lymphadenopathy Psych:  No delerium/psychosis/paranoia.  Anxious and tearful but partially consolable.  Oriented x 4 HENT: Normocephalic, Mucus membranes moist.  No thrush Neck: Supple, No tracheal deviation.  No obvious thyromegaly Chest: No pain to chest wall compression.  Good respiratory excursion.  No audible wheezing CV:  Pulses intact.  Regular rhythm.  No major extremity edema MS: Normal AROM mjr joints.  No obvious deformity  Abdomen: Soft.  Mildy distended.   Mildly tender at incisions only.  No evidence of peritonitis.  No incarcerated hernias.  Ext:  No deformity.  No mjr edema.  No cyanosis Skin: No petechiae / purpurea.  No major sores.  Warm and dry    Results:   FINAL MICROSCOPIC DIAGNOSIS:  A. GALLBLADDER, CHOLECYSTECTOMY:      Chronic cholecystitis.  B. LIVER, NEEDLE CORE BIOPSY: Steatohepatitis with no fibrosis (Kleiner stage 0 / 4). See comment.  COMMENT:  The lobular hepatic architecture is preserved. The lobules reveal patchy macrovesicular steatosis accounting for approximately 5% of overall parenchymal volume. Ballooned hepatocytes are identified without distinct Mallory hyaline inclusions or acidophil bodies. Patchy lobular inflammation is seen. The portal tracts do not show evidence of significant portal inflammation, interlobular bile duct injury, bile ductular reaction, ductopenia or granulomas.  Trichrome stain does not show fibrosis. Iron stain demonstrates focal hepatocellular iron deposit (1+).  PAS/D stains reveal ceroid laden macrophages and is negative for diagnostic cytoplasmic globules. Reticulin stain reveal intact reticulin framework.  Overall, the histologic findings demonstrate mild steatohepatitis with no fibrosis. As you know, steatohepatitis is a pattern of injury that may be seen in association with a variety of conditions, including alcohol, metabolic syndrome, obesity, medication/drug-induced injury, Wilson's disease, among others. If  non-alcoholic fatty liver disease (NAFLD) is clinically favored, the histologic findings correlate to an NAFLD Activity Score (NAS) of 3 of 8 (steatosis: 1 of 3; ballooning: 1 of 2; inflammation: 1 of 3) with Kleiner stage 0 of 4 fibrosis.   Cultures: No results found for this or any previous visit (from the past 720 hours).  Labs: Results for orders placed or performed during the hospital encounter of 10/21/23 (from the past 48 hours)  HIV Antibody (routine  testing w rflx)     Status: None   Collection Time: 10/22/23  3:54 AM  Result Value Ref Range   HIV Screen 4th Generation wRfx Non Reactive Non Reactive    Comment: Performed at [facility], [address], [place], [place] [address]  Comprehensive metabolic panel     Status: Abnormal   Collection Time: 10/22/23  3:54 AM  Result Value Ref Range   Sodium 138 135 - 145 mmol/L   Potassium 5.0 3.5 - 5.1 mmol/L   Chloride 101 98 - 111 mmol/L   CO2 26 22 - 32 mmol/L   Glucose, Bld 114 (H) 70 - 99 mg/dL    Comment: Glucose reference range applies only to samples taken after fasting for at least 8 hours.   BUN 12 6 - 20 mg/dL   Creatinine, Ser 9.02 0.61 - 1.24 mg/dL    Comment: ICTERUS AT THIS LEVEL MAY AFFECT RESULT   Calcium  9.3 8.9 - 10.3 mg/dL   Total Protein 6.4 (L) 6.5 - 8.1 g/dL   Albumin 3.7 3.5 - 5.0 g/dL   AST 732 (H) 15 - 41 U/L   ALT 487 (H) 0 - 44 U/L   Alkaline Phosphatase 237 (H) 38 - 126 U/L   Total Bilirubin 8.2 (H) 0.0 - 1.2 mg/dL    Comment: Delta check noted    GFR, Estimated >60 >60 mL/min    Comment: (NOTE) Calculated using the CKD-EPI Creatinine Equation (2021)    Anion gap 11 5 - 15    Comment: Performed at [facility], [address], [place], [place] [address]  CBC     Status: None   Collection Time: 10/22/23  3:54 AM  Result Value Ref Range   WBC 6.7 4.0 - 10.5 K/uL   RBC 4.53 4.22 - 5.81 MIL/uL   Hemoglobin 13.5 13.0 - 17.0 g/dL   HCT 56.3 60.9 - 47.9 %   MCV 96.2 80.0 - 100.0 fL   MCH 29.8 26.0 - 34.0 pg   MCHC 31.0 30.0 - 36.0 g/dL   RDW 87.0 88.4 - 84.4 %   Platelets 260 150 - 400 K/uL   nRBC 0.0 0.0 - 0.2 %    Comment: Performed at [facility], [address] [place], [place] [address]    Imaging / Studies: MR ABDOMEN WITH MRCP W CONTRAST Result Date: 10/21/2023 CLINICAL DATA:  Abdominal pain. Patient is 2 days status post cholecystectomy. EXAM: MRI ABDOMEN WITH CONTRAST (WITH MRCP) TECHNIQUE:  Multiplanar multisequence MR imaging of the abdomen was performed following the administration of intravenous contrast. Heavily T2-weighted images of the biliary and pancreatic ducts were obtained, and three-dimensional MRCP images were rendered by post processing. CONTRAST:  7mL GADAVIST  GADOBUTROL  1 MMOL/ML IV SOLN COMPARISON:  Nuclear medicine hepatobiliary scan, same date. FINDINGS: Lower chest: Small bilateral pleural effusions and streaky bibasilar atelectasis. No pericardial effusion. Hepatobiliary: No hepatic lesions or intrahepatic biliary dilatation. Normal caliber and course of the common bile duct. No common  bile duct stones are identified. Pancreas:  No mass, inflammation or ductal dilatation. Spleen:  Within normal limits in size and appearance. Adrenals/Urinary Tract:  Adrenal glands and kidneys unremarkable. Stomach/Bowel: The stomach, duodenum, visualized small bowel and colon are unremarkable. Moderate gaseous distension of the visualized colon may suggest a postoperative ileus. No small bowel dilatation. Vascular/Lymphatic: No pathologically enlarged lymph nodes identified. No abdominal aortic aneurysm demonstrated. Other: Small amount of free air is noted and not unexpected this soon after surgery. There is also small amount of fluid/inflammation/edema noted in the gallbladder fossa and in the periportal region and anterior pararenal space all considered normal given the recent surgery. No large fluid collection to suggest a biloma or hematoma. Musculoskeletal: No significant bony findings. IMPRESSION: 1. Status post cholecystectomy. Expected postoperative changes as above. No large fluid collection to suggest a biloma or hematoma. 2. Normal caliber and course of the common bile duct. No common bile duct stones are identified. 3. Small amount of free air and fluid/inflammation/edema in the gallbladder fossa and in the periportal region and anterior pararenal space all considered normal given the  recent surgery. 4. Small bilateral pleural effusions and streaky bibasilar atelectasis. 5. Moderate gaseous distension of the visualized colon may suggest a postoperative ileus. Electronically Signed   By: MYRTIS Stammer M.D.   On: 10/21/2023 20:12   MR 3D Recon At Scanner Result Date: 10/21/2023 CLINICAL DATA:  Abdominal pain. Patient is 2 days status post cholecystectomy. EXAM: MRI ABDOMEN WITH CONTRAST (WITH MRCP) TECHNIQUE: Multiplanar multisequence MR imaging of the abdomen was performed following the administration of intravenous contrast. Heavily T2-weighted images of the biliary and pancreatic ducts were obtained, and three-dimensional MRCP images were rendered by post processing. CONTRAST:  7mL GADAVIST  GADOBUTROL  1 MMOL/ML IV SOLN COMPARISON:  Nuclear medicine hepatobiliary scan, same date. FINDINGS: Lower chest: Small bilateral pleural effusions and streaky bibasilar atelectasis. No pericardial effusion. Hepatobiliary: No hepatic lesions or intrahepatic biliary dilatation. Normal caliber and course of the common bile duct. No common bile duct stones are identified. Pancreas:  No mass, inflammation or ductal dilatation. Spleen:  Within normal limits in size and appearance. Adrenals/Urinary Tract:  Adrenal glands and kidneys unremarkable. Stomach/Bowel: The stomach, duodenum, visualized small bowel and colon are unremarkable. Moderate gaseous distension of the visualized colon may suggest a postoperative ileus. No small bowel dilatation. Vascular/Lymphatic: No pathologically enlarged lymph nodes identified. No abdominal aortic aneurysm demonstrated. Other: Small amount of free air is noted and not unexpected this soon after surgery. There is also small amount of fluid/inflammation/edema noted in the gallbladder fossa and in the periportal region and anterior pararenal space all considered normal given the recent surgery. No large fluid collection to suggest a biloma or hematoma. Musculoskeletal: No  significant bony findings. IMPRESSION: 1. Status post cholecystectomy. Expected postoperative changes as above. No large fluid collection to suggest a biloma or hematoma. 2. Normal caliber and course of the common bile duct. No common bile duct stones are identified. 3. Small amount of free air and fluid/inflammation/edema in the gallbladder fossa and in the periportal region and anterior pararenal space all considered normal given the recent surgery. 4. Small bilateral pleural effusions and streaky bibasilar atelectasis. 5. Moderate gaseous distension of the visualized colon may suggest a postoperative ileus. Electronically Signed   By: MYRTIS Stammer M.D.   On: 10/21/2023 20:12   NM HEPATOBILIARY LEAK (POST-SURGICAL) Result Date: 10/21/2023 CLINICAL DATA:  Two days postop from cholecystectomy. Severe right upper quadrant pain. Evaluate for postop bile leak.  EXAM: NUCLEAR MEDICINE HEPATOBILIARY IMAGING TECHNIQUE: Sequential images of the abdomen were obtained out to 120 minutes following intravenous administration of radiopharmaceutical. RADIOPHARMACEUTICALS:  7.4 mCi Tc-52m  Choletec  IV COMPARISON:  None Available. FINDINGS: Prompt uptake of activity by the liver is seen. No biliary excretion of radiopharmaceutical activity is seen. This is suspicious for biliary obstruction, although hepatocellular dysfunction is also a consideration. Given lack of biliary excretion, postop bile leak cannot be excluded. IMPRESSION: Lack of biliary excretion seen, highly suspicious for biliary obstruction, with hepatocellular dysfunction considered less likely. Postop bile leak cannot be excluded on this exam. Electronically Signed   By: Norleen DELENA Kil M.D.   On: 10/21/2023 12:49   CT Angio Chest Pulmonary Embolism (PE) W or WO Contrast Result Date: 10/21/2023 CLINICAL DATA:  Epigastric pain and shortness of breath and nausea. Clinical concern for pulmonary embolus. EXAM: CT ANGIOGRAPHY CHEST WITH CONTRAST TECHNIQUE:  Multidetector CT imaging of the chest was performed using the standard protocol during bolus administration of intravenous contrast. Multiplanar CT image reconstructions and MIPs were obtained to evaluate the vascular anatomy. RADIATION DOSE REDUCTION: This exam was performed according to the departmental dose-optimization program which includes automated exposure control, adjustment of the mA and/or kV according to patient size and/or use of iterative reconstruction technique. CONTRAST:  75mL OMNIPAQUE  IOHEXOL  350 MG/ML SOLN COMPARISON:  07/01/2019 FINDINGS: Cardiovascular: The heart size is normal. No substantial pericardial effusion. No thoracic aortic aneurysm. No substantial atherosclerotic calcification of the thoracic aorta. There is no filling defect within the opacified pulmonary arteries to suggest the presence of an acute pulmonary embolus. Mediastinum/Nodes: No mediastinal lymphadenopathy. There is no hilar lymphadenopathy. The esophagus has normal imaging features. There is no axillary lymphadenopathy. Lungs/Pleura: Calcified granuloma noted right upper lobe. Dependent collapse/consolidation noted in both lung bases, right greater than left. No pulmonary edema or substantial pleural effusion. Upper Abdomen: 13 mm hypervascular lesion is seen in the dome of the liver, not evident on previous chest CTA or abdomen CT of 12/29/2021. Free gas is identified anterior to the liver. Musculoskeletal: No worrisome lytic or sclerotic osseous abnormality. Review of the MIP images confirms the above findings. IMPRESSION: 1. No CT evidence for acute pulmonary embolus. 2. Dependent collapse/consolidation in both lung bases, right greater than left. Imaging features likely reflect dependent atelectasis. 3. Free gas anterior to the liver. This is compatible with the recent surgery on 10/19/2023. 4. 13 mm hypervascular lesion in the dome of the liver, not evident on previous chest CTA or abdomen CT of 12/29/2021. This is  probably a benign lesion such as a flash filling hemangioma, FNH or vascular malformation. Follow-up MRI of the abdomen with and without contrast recommended to further evaluate. MRI of the abdomen should be deferred until the patient recovers from acute symptoms and can best participate with positioning and reproducible breath holding. Motion degraded study may well be nondiagnostic. Electronically Signed   By: Camellia Candle M.D.   On: 10/21/2023 09:27    Medications / Allergies: per chart  Antibiotics: Anti-infectives (From admission, onward)    None         Note: Portions of this report may have been transcribed using voice recognition software. Every effort was made to ensure accuracy; however, inadvertent computerized transcription errors may be present.   Any transcriptional errors that result from this process are unintentional.    Elspeth KYM Schultze, MD, FACS, MASCRS Esophageal, Gastrointestinal & Colorectal Surgery Robotic and Minimally Invasive Surgery  [facility] [address] [place], [place] [address] [phone] Fax [phone] Main  CONTACT INFORMATION: Weekday (9AM-5PM): Call [facility] main office at [phone] Weeknight (5PM-9AM) or Weekend/Holiday: Check EPIC Web Links tab & use AMION (password  [email or web address]) for General Surgery [facility] coverage  Please, DO NOT use SecureChat  (it is not reliable communication to reach operating surgeons & will lead to a delay in care).   Epic staff messaging available for outptient concerns needing 1-2 business day response.      10/23/2023  7:54 AM

## 2023-10-23 NOTE — Progress Notes (Signed)
   10/23/23 1055  TOC Brief Assessment  Insurance and Status Reviewed  Patient has primary care physician Yes  Home environment has been reviewed resides in private residence  Prior level of function: Independent  Prior/Current Home Services No current home services  Social Drivers of Health Review SDOH reviewed no interventions necessary  Readmission risk has been reviewed Yes  Transition of care needs no transition of care needs at this time

## 2023-10-23 NOTE — Plan of Care (Signed)
   Problem: Coping: Goal: Level of anxiety will decrease Outcome: Progressing   Problem: Pain Managment: Goal: General experience of comfort will improve and/or be controlled Outcome: Progressing   Problem: Safety: Goal: Ability to remain free from injury will improve Outcome: Progressing

## 2023-10-23 NOTE — Progress Notes (Signed)
 [facility] Progress Note  SUBJECTIVE:   Interval history: Edward Carpenter was seen and evaluated today at bedside. Ambulating in room. Noted having right sided abdominal pain. No nausea or vomiting. Constipation. No chest pain. Shortness of breath as he has worsened abdominal pain with deep breaths.  Past Medical History:  Diagnosis Date   Allergy    Anxiety 2010   Chronic cholecystitis 10/19/2023   Depression    Diverticulitis    Family history of adverse reaction to anesthesia    mother cannot go under due to coginetal defect   GERD (gastroesophageal reflux disease)    Past Surgical History:  Procedure Laterality Date   arm surgery Right    reconstruction   CHOLECYSTECTOMY N/A 10/19/2023   Procedure: LAPAROSCOPIC CHOLECYSTECTOMY WITH INTRAOPERATIVE CHOLANGIOGRAM;  Surgeon: Sheldon Standing, MD;  Location: [facility];  Service: General;  Laterality: N/A;  SINGLE SITE   COLONOSCOPY     x2   LIVER BIOPSY N/A 10/19/2023   Procedure: BIOPSY, LIVER;  Surgeon: Sheldon Standing, MD;  Location: [facility];  Service: General;  Laterality: N/A;  POSSIBLE NEEDLE CORE BIOPSY OF LIVER   Current Facility-Administered Medications  Medication Dose Route Frequency Provider Last Rate Last Admin   0.9 %  sodium chloride  infusion   Intravenous Continuous Brahmbhatt, Parag, MD 20 mL/hr at 10/22/23 1424 New Bag at 10/22/23 1424   acetaminophen  (TYLENOL ) suppository 650 mg  650 mg Rectal Q6H PRN Sheldon Standing, MD       acetaminophen  (TYLENOL ) tablet 325-650 mg  325-650 mg Oral Q6H PRN Sheldon Standing, MD       alum & mag hydroxide-simeth (MAALOX/MYLANTA) 200-200-20 MG/5ML suspension 30 mL  30 mL Oral Q6H PRN Sheldon Standing, MD       bisacodyl  (DULCOLAX) suppository 10 mg  10 mg Rectal Q12H PRN Sheldon Standing, MD       cefTRIAXone  (ROCEPHIN ) 2 g in sodium chloride  0.9 % 100 mL IVPB  2 g Intravenous Q24H Sheldon Standing, MD 200 mL/hr at 10/23/23 1017 2 g at 10/23/23 1017   clonazePAM  (KLONOPIN ) tablet 0.5 mg  0.5  mg Oral TID PRN Sheldon Standing, MD       diphenhydrAMINE  (BENADRYL ) 12.5 MG/5ML elixir 12.5 mg  12.5 mg Oral Q6H PRN Aron Shoulders, MD       diphenhydrAMINE  (BENADRYL ) injection 12.5-25 mg  12.5-25 mg Intravenous Q6H PRN Sheldon Standing, MD       enoxaparin  (LOVENOX ) injection 40 mg  40 mg Subcutaneous Q24H Aron Shoulders, MD       HYDROmorphone  (DILAUDID ) injection 0.5-1 mg  0.5-1 mg Intravenous Q2H PRN Sheldon Standing, MD   1 mg at 10/23/23 9171   lactated ringers  bolus 1,000 mL  1,000 mL Intravenous Q8H PRN Sheldon Standing, MD       magic mouthwash  15 mL Oral QID PRN Sheldon Standing, MD       melatonin tablet 3 mg  3 mg Oral QHS PRN Aron Shoulders, MD       menthol  (CEPACOL) lozenge 3 mg  1 lozenge Oral PRN Sheldon Standing, MD       methocarbamol  (ROBAXIN ) injection 1,000 mg  1,000 mg Intravenous Q6H PRN Sheldon Standing, MD       methocarbamol  (ROBAXIN ) tablet 1,000 mg  1,000 mg Oral Q6H PRN Sheldon Standing, MD       metoprolol  tartrate (LOPRESSOR ) injection 5 mg  5 mg Intravenous Q6H PRN Sheldon Standing, MD       naphazoline-glycerin  (CLEAR EYES REDNESS) ophth solution  1-2 drop  1-2 drop Both Eyes QID PRN Sheldon Standing, MD       ondansetron  (ZOFRAN -ODT) disintegrating tablet 4 mg  4 mg Oral Q6H PRN Byerly, Faera, MD       Or   ondansetron  (ZOFRAN ) injection 4 mg  4 mg Intravenous Q6H PRN Byerly, Faera, MD   4 mg at 10/22/23 1651   ondansetron  (ZOFRAN ) tablet 4 mg  4 mg Oral Q8H PRN Aron Shoulders, MD       oxyCODONE  (Oxy IR/ROXICODONE ) immediate release tablet 5-10 mg  5-10 mg Oral Q4H PRN Sheldon Standing, MD       pantoprazole  (PROTONIX ) EC tablet 40 mg  40 mg Oral Daily PRN Byerly, Faera, MD       pantoprazole  (PROTONIX ) EC tablet 40 mg  40 mg Oral BID THEOPOLIS Sheldon Standing, MD   40 mg at 10/23/23 1014   phenol (CHLORASEPTIC) mouth spray 2 spray  2 spray Mouth/Throat PRN Sheldon Standing, MD       polycarbophil (FIBERCON) tablet 625 mg  625 mg Oral BID Sheldon Standing, MD   625 mg at 10/23/23 1014   polyethylene glycol  (MIRALAX  / GLYCOLAX ) packet 17 g  17 g Oral Daily Byerly, Faera, MD       prochlorperazine  (COMPAZINE ) injection 5-10 mg  5-10 mg Intravenous Q4H PRN Sheldon Standing, MD       prochlorperazine  (COMPAZINE ) tablet 10 mg  10 mg Oral Q6H PRN Aron Shoulders, MD       simethicone  (MYLICON) 40 MG/0.6ML suspension 80 mg  80 mg Oral QID PRN Sheldon Standing, MD       sodium chloride  (OCEAN) 0.65 % nasal spray 1-2 spray  1-2 spray Each Nare Q6H PRN Sheldon Standing, MD       traMADol  (ULTRAM ) tablet 50-100 mg  50-100 mg Oral Q6H PRN Aron Shoulders, MD       Allergies as of 10/21/2023 - Review Complete 10/21/2023  Allergen Reaction Noted   Prednisone Other (See Comments) 12/30/2019   Review of Systems:  Review of Systems  Respiratory:  Positive for shortness of breath.   Cardiovascular:  Negative for chest pain.  Gastrointestinal:  Positive for abdominal pain and constipation. Negative for nausea and vomiting.    OBJECTIVE:   Temp:  [97.9 F (36.6 C)-99.9 F (37.7 C)] 98.8 F (37.1 C) (09/22 0558) Pulse Rate:  [97-112] 98 (09/22 0558) Resp:  [16-19] 18 (09/22 0558) BP: (110-153)/(67-96) 142/96 (09/22 0558) SpO2:  [94 %-99 %] 99 % (09/22 0558) Weight:  [72.1 kg] 72.1 kg (09/21 1156) Last BM Date : 10/18/23 Physical Exam Constitutional:      General: He is not in acute distress.    Appearance: He is not ill-appearing, toxic-appearing or diaphoretic.  Cardiovascular:     Rate and Rhythm: Normal rate and regular rhythm.  Pulmonary:     Effort: No respiratory distress.     Breath sounds: Normal breath sounds.  Abdominal:     General: Bowel sounds are normal.     Palpations: Abdomen is soft.     Tenderness: There is abdominal tenderness.  Neurological:     Mental Status: He is alert.     Labs: Recent Labs    10/21/23 0335 10/21/23 0343 10/22/23 0354  WBC 11.8*  --  6.7  HGB 15.1 15.0 13.5  HCT 45.4 44.0 43.6  PLT 291  --  260   BMET Recent Labs    10/21/23 0335 10/21/23 0343  10/22/23 0354  NA 138  137 138  K 3.9 4.1 5.0  CL 101 100 101  CO2 24  --  26  GLUCOSE 112* 109* 114*  BUN 12 13 12   CREATININE 0.80 1.00 0.97  CALCIUM  9.6  --  9.3   LFT Recent Labs    10/22/23 0354  PROT 6.4*  ALBUMIN 3.7  AST 267*  ALT 487*  ALKPHOS 237*  BILITOT 8.2*   PT/INR No results for input(s): LABPROT, INR in the last 72 hours. Diagnostic imaging: MR ABDOMEN WITH MRCP W CONTRAST Result Date: 10/21/2023 CLINICAL DATA:  Abdominal pain. Patient is 2 days status post cholecystectomy. EXAM: MRI ABDOMEN WITH CONTRAST (WITH MRCP) TECHNIQUE: Multiplanar multisequence MR imaging of the abdomen was performed following the administration of intravenous contrast. Heavily T2-weighted images of the biliary and pancreatic ducts were obtained, and three-dimensional MRCP images were rendered by post processing. CONTRAST:  7mL GADAVIST  GADOBUTROL  1 MMOL/ML IV SOLN COMPARISON:  Nuclear medicine hepatobiliary scan, same date. FINDINGS: Lower chest: Small bilateral pleural effusions and streaky bibasilar atelectasis. No pericardial effusion. Hepatobiliary: No hepatic lesions or intrahepatic biliary dilatation. Normal caliber and course of the common bile duct. No common bile duct stones are identified. Pancreas:  No mass, inflammation or ductal dilatation. Spleen:  Within normal limits in size and appearance. Adrenals/Urinary Tract:  Adrenal glands and kidneys unremarkable. Stomach/Bowel: The stomach, duodenum, visualized small bowel and colon are unremarkable. Moderate gaseous distension of the visualized colon may suggest a postoperative ileus. No small bowel dilatation. Vascular/Lymphatic: No pathologically enlarged lymph nodes identified. No abdominal aortic aneurysm demonstrated. Other: Small amount of free air is noted and not unexpected this soon after surgery. There is also small amount of fluid/inflammation/edema noted in the gallbladder fossa and in the periportal region and anterior  pararenal space all considered normal given the recent surgery. No large fluid collection to suggest a biloma or hematoma. Musculoskeletal: No significant bony findings. IMPRESSION: 1. Status post cholecystectomy. Expected postoperative changes as above. No large fluid collection to suggest a biloma or hematoma. 2. Normal caliber and course of the common bile duct. No common bile duct stones are identified. 3. Small amount of free air and fluid/inflammation/edema in the gallbladder fossa and in the periportal region and anterior pararenal space all considered normal given the recent surgery. 4. Small bilateral pleural effusions and streaky bibasilar atelectasis. 5. Moderate gaseous distension of the visualized colon may suggest a postoperative ileus. Electronically Signed   By: MYRTIS Stammer M.D.   On: 10/21/2023 20:12   MR 3D Recon At Scanner Result Date: 10/21/2023 CLINICAL DATA:  Abdominal pain. Patient is 2 days status post cholecystectomy. EXAM: MRI ABDOMEN WITH CONTRAST (WITH MRCP) TECHNIQUE: Multiplanar multisequence MR imaging of the abdomen was performed following the administration of intravenous contrast. Heavily T2-weighted images of the biliary and pancreatic ducts were obtained, and three-dimensional MRCP images were rendered by post processing. CONTRAST:  7mL GADAVIST  GADOBUTROL  1 MMOL/ML IV SOLN COMPARISON:  Nuclear medicine hepatobiliary scan, same date. FINDINGS: Lower chest: Small bilateral pleural effusions and streaky bibasilar atelectasis. No pericardial effusion. Hepatobiliary: No hepatic lesions or intrahepatic biliary dilatation. Normal caliber and course of the common bile duct. No common bile duct stones are identified. Pancreas:  No mass, inflammation or ductal dilatation. Spleen:  Within normal limits in size and appearance. Adrenals/Urinary Tract:  Adrenal glands and kidneys unremarkable. Stomach/Bowel: The stomach, duodenum, visualized small bowel and colon are unremarkable.  Moderate gaseous distension of the visualized colon may suggest a postoperative ileus. No small bowel  dilatation. Vascular/Lymphatic: No pathologically enlarged lymph nodes identified. No abdominal aortic aneurysm demonstrated. Other: Small amount of free air is noted and not unexpected this soon after surgery. There is also small amount of fluid/inflammation/edema noted in the gallbladder fossa and in the periportal region and anterior pararenal space all considered normal given the recent surgery. No large fluid collection to suggest a biloma or hematoma. Musculoskeletal: No significant bony findings. IMPRESSION: 1. Status post cholecystectomy. Expected postoperative changes as above. No large fluid collection to suggest a biloma or hematoma. 2. Normal caliber and course of the common bile duct. No common bile duct stones are identified. 3. Small amount of free air and fluid/inflammation/edema in the gallbladder fossa and in the periportal region and anterior pararenal space all considered normal given the recent surgery. 4. Small bilateral pleural effusions and streaky bibasilar atelectasis. 5. Moderate gaseous distension of the visualized colon may suggest a postoperative ileus. Electronically Signed   By: MYRTIS Stammer M.D.   On: 10/21/2023 20:12   NM HEPATOBILIARY LEAK (POST-SURGICAL) Result Date: 10/21/2023 CLINICAL DATA:  Two days postop from cholecystectomy. Severe right upper quadrant pain. Evaluate for postop bile leak. EXAM: NUCLEAR MEDICINE HEPATOBILIARY IMAGING TECHNIQUE: Sequential images of the abdomen were obtained out to 120 minutes following intravenous administration of radiopharmaceutical. RADIOPHARMACEUTICALS:  7.4 mCi Tc-70m  Choletec  IV COMPARISON:  None Available. FINDINGS: Prompt uptake of activity by the liver is seen. No biliary excretion of radiopharmaceutical activity is seen. This is suspicious for biliary obstruction, although hepatocellular dysfunction is also a consideration.  Given lack of biliary excretion, postop bile leak cannot be excluded. IMPRESSION: Lack of biliary excretion seen, highly suspicious for biliary obstruction, with hepatocellular dysfunction considered less likely. Postop bile leak cannot be excluded on this exam. Electronically Signed   By: Norleen DELENA Kil M.D.   On: 10/21/2023 12:49   IMPRESSION: Hyperbilirubinemia Choledocholithiasis/sludge  Gastritis on EUS 10/22/23 Transaminase elevation in mixed pattern POD#4 s/p laparoscopic cholecystectomy for chronic cholecystitis  PLAN: -Plan for ERCP 10/24/23 with Dr. Rosalie -Await pathology from EUS -Trend liver enzymes -NPO at midnight for ERCP 10/24/23   LOS: 0 days   Estefana Keas, [facility]

## 2023-10-23 NOTE — Plan of Care (Signed)
  Problem: Education: Goal: Knowledge of General Education information will improve Description: Including pain rating scale, medication(s)/side effects and non-pharmacologic comfort measures Outcome: Progressing   Problem: Health Behavior/Discharge Planning: Goal: Ability to manage health-related needs will improve Outcome: Progressing   Problem: Clinical Measurements: Goal: Ability to maintain clinical measurements within normal limits will improve Outcome: Progressing   Problem: Activity: Goal: Risk for activity intolerance will decrease Outcome: Progressing   Problem: Nutrition: Goal: Adequate nutrition will be maintained Outcome: Progressing   Problem: Coping: Goal: Level of anxiety will decrease Outcome: Progressing   Problem: Pain Managment: Goal: General experience of comfort will improve and/or be controlled Outcome: Progressing   Problem: Safety: Goal: Ability to remain free from injury will improve Outcome: Progressing   Problem: Skin Integrity: Goal: Risk for impaired skin integrity will decrease Outcome: Progressing

## 2023-10-24 ENCOUNTER — Inpatient Hospital Stay (HOSPITAL_COMMUNITY)

## 2023-10-24 ENCOUNTER — Inpatient Hospital Stay (HOSPITAL_COMMUNITY): Admitting: Anesthesiology

## 2023-10-24 ENCOUNTER — Encounter (HOSPITAL_COMMUNITY): Payer: Self-pay | Admitting: Surgery

## 2023-10-24 ENCOUNTER — Ambulatory Visit: Payer: Self-pay | Admitting: Gastroenterology

## 2023-10-24 ENCOUNTER — Encounter (HOSPITAL_COMMUNITY)
Admission: EM | Disposition: A | Discharge status: Home / Self Care | Payer: Self-pay | Source: Home / Self Care | Attending: Surgery

## 2023-10-24 HISTORY — PX: ERCP: SHX5425

## 2023-10-24 LAB — COMPREHENSIVE METABOLIC PANEL WITH GFR
ALT: 354 U/L — ABNORMAL HIGH (ref 0–44)
AST: 190 U/L — ABNORMAL HIGH (ref 15–41)
Albumin: 3.3 g/dL — ABNORMAL LOW (ref 3.5–5.0)
Alkaline Phosphatase: 244 U/L — ABNORMAL HIGH (ref 38–126)
Anion gap: 12 (ref 5–15)
BUN: 15 mg/dL (ref 6–20)
CO2: 22 mmol/L (ref 22–32)
Calcium: 8.6 mg/dL — ABNORMAL LOW (ref 8.9–10.3)
Chloride: 102 mmol/L (ref 98–111)
Creatinine, Ser: 0.85 mg/dL (ref 0.61–1.24)
GFR, Estimated: >60 mL/min (ref >60)
Glucose, Bld: 92 mg/dL (ref 70–99)
Potassium: 4 mmol/L (ref 3.5–5.1)
Sodium: 136 mmol/L (ref 135–145)
Total Bilirubin: 6.5 mg/dL — ABNORMAL HIGH (ref 0.0–1.2)
Total Protein: 5.9 g/dL — ABNORMAL LOW (ref 6.5–8.1)

## 2023-10-24 LAB — CBC
HCT: 41.3 % (ref 39.0–52.0)
Hemoglobin: 13.2 g/dL (ref 13.0–17.0)
MCH: 30.8 pg (ref 26.0–34.0)
MCHC: 32 g/dL (ref 30.0–36.0)
MCV: 96.3 fL (ref 80.0–100.0)
Platelets: 268 K/uL (ref 150–400)
RBC: 4.29 MIL/uL (ref 4.22–5.81)
RDW: 12.8 % (ref 11.5–15.5)
WBC: 6.6 K/uL (ref 4.0–10.5)
nRBC: 0 % (ref 0.0–0.2)

## 2023-10-24 LAB — SURGICAL PATHOLOGY

## 2023-10-24 SURGERY — ERCP, WITH INTERVENTION IF INDICATED
Anesthesia: General

## 2023-10-24 MED ORDER — ROCURONIUM BROMIDE 10 MG/ML (PF) SYRINGE
PREFILLED_SYRINGE | INTRAVENOUS | Status: DC | PRN
  Administered 2023-10-24: 50 mg via INTRAVENOUS

## 2023-10-24 MED ORDER — FENTANYL CITRATE (PF) 100 MCG/2ML IJ SOLN
INTRAMUSCULAR | Status: AC
  Filled 2023-10-24: qty 2

## 2023-10-24 MED ORDER — MIDAZOLAM HCL 2 MG/2ML IJ SOLN
INTRAMUSCULAR | Status: AC
  Filled 2023-10-24: qty 2

## 2023-10-24 MED ORDER — SODIUM CHLORIDE 0.9 % IV SOLN
INTRAVENOUS | Status: DC
Start: 2023-10-24 — End: 2023-10-24

## 2023-10-24 MED ORDER — SUGAMMADEX SODIUM 200 MG/2ML IV SOLN
INTRAVENOUS | Status: DC | PRN
  Administered 2023-10-24: 200 mg via INTRAVENOUS

## 2023-10-24 MED ORDER — SODIUM CHLORIDE 0.9 % IV SOLN
INTRAVENOUS | Status: DC | PRN
  Administered 2023-10-24: 25 mL

## 2023-10-24 MED ORDER — ONDANSETRON HCL 4 MG/2ML IJ SOLN
INTRAMUSCULAR | Status: DC | PRN
  Administered 2023-10-24: 4 mg via INTRAVENOUS

## 2023-10-24 MED ORDER — SODIUM CHLORIDE 0.9 % IV SOLN: INTRAVENOUS | Status: DC

## 2023-10-24 MED ORDER — PROPOFOL 500 MG/50ML IV EMUL
INTRAVENOUS | Status: AC
  Filled 2023-10-24: qty 50

## 2023-10-24 MED ORDER — LIDOCAINE 2% (20 MG/ML) 5 ML SYRINGE
INTRAMUSCULAR | Status: DC | PRN
  Administered 2023-10-24: 100 mg via INTRAVENOUS

## 2023-10-24 MED ORDER — PROPOFOL 10 MG/ML IV BOLUS
INTRAVENOUS | Status: DC | PRN
  Administered 2023-10-24: 150 mg via INTRAVENOUS

## 2023-10-24 MED ORDER — PHENYLEPHRINE 80 MCG/ML (10ML) SYRINGE FOR IV PUSH (FOR BLOOD PRESSURE SUPPORT)
PREFILLED_SYRINGE | INTRAVENOUS | Status: DC | PRN
  Administered 2023-10-24: 160 ug via INTRAVENOUS
  Administered 2023-10-24: 80 ug via INTRAVENOUS
  Administered 2023-10-24: 160 ug via INTRAVENOUS

## 2023-10-24 MED ORDER — DICLOFENAC SUPPOSITORY 100 MG
RECTAL | Status: DC | PRN
Start: 2023-10-24 — End: 2023-10-24
  Administered 2023-10-24: 100 mg via RECTAL

## 2023-10-24 MED ORDER — GLUCAGON HCL RDNA (DIAGNOSTIC) 1 MG IJ SOLR
INTRAMUSCULAR | Status: AC
  Filled 2023-10-24: qty 1

## 2023-10-24 MED ORDER — MIDAZOLAM HCL 5 MG/5ML IJ SOLN
INTRAMUSCULAR | Status: DC | PRN
  Administered 2023-10-24: 2 mg via INTRAVENOUS

## 2023-10-24 MED ORDER — FENTANYL CITRATE (PF) 100 MCG/2ML IJ SOLN
INTRAMUSCULAR | Status: DC | PRN
  Administered 2023-10-24: 100 ug via INTRAVENOUS

## 2023-10-24 MED ORDER — DICLOFENAC SUPPOSITORY 100 MG
RECTAL | Status: AC
  Filled 2023-10-24: qty 1

## 2023-10-24 NOTE — Anesthesia Procedure Notes (Signed)
 Procedure Name: Intubation Date/Time: 10/24/2023 11:30 AM  Performed by: Carleton Garnette SAUNDERS, CRNAPre-anesthesia Checklist: Patient identified, Emergency Drugs available, Suction available, Patient being monitored and Timeout performed Patient Re-evaluated:Patient Re-evaluated prior to induction Oxygen Delivery Method: Circle system utilized Preoxygenation: Pre-oxygenation with 100% oxygen Induction Type: IV induction Ventilation: Mask ventilation without difficulty Laryngoscope Size: Mac and 4 Grade View: Grade I Tube type: Oral Tube size: 7.5 mm Number of attempts: 1 Airway Equipment and Method: Stylet Placement Confirmation: ETT inserted through vocal cords under direct vision, positive ETCO2 and breath sounds checked- equal and bilateral Secured at: 23 cm Tube secured with: Tape Dental Injury: Teeth and Oropharynx as per pre-operative assessment

## 2023-10-24 NOTE — Op Note (Addendum)
 [facility] Patient Name: Edward Carpenter Procedure Date: 10/24/2023 MRN: [id number] Attending MD: Oliva Boots , MD, [id number] Date of Birth: August 20, 1970 CSN: [id number] Age: 53 Admit Type: Inpatient Procedure:                ERCP Indications:              Bile duct stone(s) versus sludge on EUS, Jaundice Providers:                Oliva Boots, MD, Jacquelyn Jaci Pierce, RN,                            [facility] Clinton Petiford, Technician Referring MD:              Medicines:                General Anesthesia Complications:            No immediate complications. Estimated Blood Loss:     Estimated blood loss: none. Procedure:                Pre-Anesthesia Assessment:                           - Prior to the procedure, a History and Physical                            was performed, and patient medications and                            allergies were reviewed. The patient's tolerance of                            previous anesthesia was also reviewed. The risks                            and benefits of the procedure and the sedation                            options and risks were discussed with the patient.                            All questions were answered, and informed consent                            was obtained. Prior Anticoagulants: The patient has                            taken no anticoagulant or antiplatelet agents. ASA                            Grade Assessment: II - A patient with mild systemic                            disease. After reviewing the risks and benefits,  the patient was deemed in satisfactory condition to                            undergo the procedure.                           After obtaining informed consent, the scope was                            passed under direct vision. Throughout the                            procedure, the patient's blood pressure, pulse, and                            oxygen  saturations were monitored continuously. The                            [id number]-[id number] ([id number] Olympus duodenoscope was                            introduced through the mouth, and used to inject                            contrast into and used to cannulate the bile duct.                            The ERCP was somewhat difficult due to challenging                            cannulation because of abnormal anatomy. Successful                            completion of the procedure was aided by performing                            the maneuvers documented (below) in this report.                            The patient tolerated the procedure well. Scope In: Scope Out: Findings:      The major papilla was normal. We had some difficulty cannulating the CBD       and the wire could not be advanced but it also could not be advanced       towards the pancreas so we proceeded with A small biliary pre-cut       sphincterotomy was made with a Hydratome sphincterotome using ERBE       electrocautery. There was no post-sphincterotomy bleeding. This maneuver       enabled us  to readily obtain deep selective CBD cannulation and nothing       obvious was seen on initial cholangiogram and we completed the biliary       sphincterotomy was made with a Hydratome sphincterotome using ERBE       electrocautery. There was no post-sphincterotomy bleeding. Adequate  biliary drainage was seen and the biliary tree was swept with a 12 mm       balloon starting at the bifurcation multiple times. Sludge was swept       from the duct. An occlusion cholangiogram at the end of the procedure       did not reveal any filling defects and there was sluggish but probably       adequate biliary drainage and we elected to stop the procedure at this       point and the wire balloon catheter and scope were all removed and as       above there was no pancreatic duct injection or wire advancement       throughout the procedure  and the patient tolerated the procedure well Impression:               - The major papilla appeared normal.                           - A small precut biliary sphincterotomy was                            performed to assist with cannulation.                           - A biliary sphincterotomy was performed.                           - The biliary tree was swept and sludge was found. Moderate Sedation:      Not Applicable - Patient had care per Anesthesia. Recommendation:           - Clear liquid diet for 6 hours. If doing well this                            evening at 6 PM may have soft solid                           - Continue present medications.                           - Return to GI clinic PRN.                           - Telephone GI clinic if symptomatic PRN.                           - Check liver enzymes (AST, ALT, alkaline                            phosphatase, bilirubin) and hemogram with white                            blood cell count and platelets in the morning. And                            follow back to normal as an outpatient Procedure Code(s):        ---  Professional ---                           [phone], Endoscopic retrograde                            cholangiopancreatography (ERCP); with removal of                            calculi/debris from biliary/pancreatic duct(s)                           43262, Endoscopic retrograde                            cholangiopancreatography (ERCP); with                            sphincterotomy/papillotomy Diagnosis Code(s):        --- Professional ---                           K80.50, Calculus of bile duct without cholangitis                            or cholecystitis without obstruction                           R17, Unspecified jaundice CPT copyright 2022 [employer] Association. All rights reserved. The codes documented in this report are preliminary and upon coder review may  be revised to meet current  compliance requirements. Oliva Boots, MD 10/24/2023 12:12:31 PM This report has been signed electronically. Number of Addenda: 0

## 2023-10-24 NOTE — Transfer of Care (Signed)
 Immediate Anesthesia Transfer of Care Note  Patient: Edward Carpenter  Procedure(s) Performed: ERCP, WITH INTERVENTION IF INDICATED  Patient Location: PACU  Anesthesia Type:General  Level of Consciousness: sedated  Airway & Oxygen Therapy: Patient Spontanous Breathing and Patient connected to face mask oxygen  Post-op Assessment: Report given to RN and Post -op Vital signs reviewed and stable  Post vital signs: Reviewed and stable  Last Vitals:  Vitals Value Taken Time  BP 121/67 10/24/23 12:17  Temp    Pulse 83 10/24/23 12:17  Resp 11 10/24/23 12:17  SpO2 100 % 10/24/23 12:17  Vitals shown include unfiled device data.  Last Pain:  Vitals:   10/24/23 1030  TempSrc: Temporal  PainSc: 3       Patients Stated Pain Goal: 7 (10/22/23 1259)  Complications: No notable events documented.

## 2023-10-24 NOTE — Progress Notes (Signed)
 Edward Carpenter 11:02 AM  Subjective: Patient seen and examined and his hospital computer chart reviewed and case discussed with both Drs. Mansouraty and Brahmbhatt and he is actually doing better than he was and we rediscussed the procedure including risks methods and success rate and answered all of his questions Objective: Vital signs stable afebrile no acute distress exam please see preassessment evaluation he still is a little tender and probably distended LFTs decreased except for alk phos CBC okay  Assessment: CBD stones versus sludge  Plan: As above the risk benefits methods and success rate of ERCP was discussed with the patient and we will proceed today with anesthesia assistance  [facility]  office [phone] After 5PM or if no answer call [phone]

## 2023-10-24 NOTE — Plan of Care (Signed)
   Problem: Coping: Goal: Level of anxiety will decrease Outcome: Progressing   Problem: Pain Managment: Goal: General experience of comfort will improve and/or be controlled Outcome: Progressing   Problem: Safety: Goal: Ability to remain free from injury will improve Outcome: Progressing

## 2023-10-24 NOTE — Anesthesia Preprocedure Evaluation (Signed)
 Anesthesia Evaluation  Patient identified by MRN, date of birth, ID band Patient awake    Reviewed: Allergy & Precautions, NPO status , Patient's Chart, lab work & pertinent test results  Airway Mallampati: II  TM Distance: >3 FB Neck ROM: Full    Dental  (+) Teeth Intact, Dental Advisory Given   Pulmonary neg pulmonary ROS   Pulmonary exam normal breath sounds clear to auscultation       Cardiovascular negative cardio ROS Normal cardiovascular exam Rhythm:Regular Rate:Normal     Neuro/Psych  PSYCHIATRIC DISORDERS Anxiety Depression    negative neurological ROS     GI/Hepatic ,GERD  Medicated,, biliary echogenic sludge with jaundice post cholecystectomy   Endo/Other  negative endocrine ROS    Renal/GU negative Renal ROS     Musculoskeletal negative musculoskeletal ROS (+)    Abdominal   Peds  Hematology negative hematology ROS (+)   Anesthesia Other Findings Day of surgery medications reviewed with the patient.  Reproductive/Obstetrics                              Anesthesia Physical Anesthesia Plan  ASA: 2  Anesthesia Plan: General   Post-op Pain Management:    Induction: Intravenous  PONV Risk Score and Plan: 2 and Dexamethasone and Ondansetron   Airway Management Planned: Oral ETT  Additional Equipment:   Intra-op Plan:   Post-operative Plan: Extubation in OR  Informed Consent: I have reviewed the patients History and Physical, chart, labs and discussed the procedure including the risks, benefits and alternatives for the proposed anesthesia with the patient or authorized representative who has indicated his/her understanding and acceptance.     Dental advisory given  Plan Discussed with: CRNA  Anesthesia Plan Comments:         Anesthesia Quick Evaluation

## 2023-10-24 NOTE — Progress Notes (Signed)
 10/24/2023  Edward Carpenter [id number] 1970/12/17  CARE TEAM: PCP: Kathrene Mardy HERO, PA-C  Outpatient Care Team: Patient Care Team: Allwardt, Mardy HERO RIGGERS as PCP - General (Physician Assistant) Sheldon Standing, MD as Consulting Physician (General Surgery) Elicia Claw, MD as Consulting Physician (Gastroenterology)  Inpatient Treatment Team: Treatment Team:  Sheldon Standing, MD Elicia Claw, MD Kristy Justine DELENA, RN Rosalie Kitchens, MD Logan Carrier, RN Casimir Camelia RAMAN, RPH Zamudio-Aguilar, Breck MATSU, NT Estelle Hunter DEL, RN Ezzard Noreen Lavonia JONELLE, RN   Problem List:   Principal Problem:   Choledocholithiasis with obstruction Active Problems:   Chronic GERD   Hepatic steatosis   Elevated LFTs   Epigastric pain   Transaminitis   Biliary sludge  10/19/2023  POST-OPERATIVE DIAGNOSIS:  Chronic Cholecystitis   PROCEDURE:  Core Liver Biopsy (CPT code 52998) & SINGLE SITE Laparoscopic cholecystectomy with intraoperative cholangiogram (CPT code 52436)   SURGEON:  Standing KYM Sheldon, MD, FACS.   OR FINDINGS:  Gallbladder with some grey changes, moderately dense omental adhesions, and thickening; suspicious for chronic cholecystitis.  Cholangiogram without any evidence of any obstruction or concern.  Rather normal biliary anatomy.   Liver: Mild steatosis.  Core liver biopsies done to rule out steatohepatitis or other concerns.  FINAL MICROSCOPIC DIAGNOSIS:  A. GALLBLADDER, CHOLECYSTECTOMY:      Chronic cholecystitis.  B. LIVER, NEEDLE CORE BIOPSY: Steatohepatitis with no fibrosis (Kleiner stage 0 / 4).    10/22/2023  Procedure(s): ULTRASOUND, UPPER GI TRACT, ENDOSCOPIC EGD (ESOPHAGOGASTRODUODENOSCOPY)    Assessment [facility] Stay = 1 days) 2 Days Post-Op    Worsening jaundice presumably due to sludge in common bile duct    Plan:  ERCP today  Bowel rest  Nausea and pain control.  Anxiolysis.  Will start IV antibiotics.  Ceftriaxone   for now.  Given epigastric discomfort, will start PPIs as well.  Some evidence of some constipation.  Bowel regimen p.o. and suppositories.  -monitor electrolytes & replace as needed  Keep K>4, Mg>2, Phos>3  -VTE prophylaxis- SCDs.  Anticoagulation prophyllaxis SQ as appropriate  -mobilize as tolerated to help recovery.  Enlist therapies in moderate/high risk patients as appropriate  I updated the patient's status to the patient and his wife at bedside.  Patient is tearful but consolable.  Wife is Adult nurse.  Discussed with nursing and just outside room as well.  I reached out to Dr. Elicia wih [facility].  He cannot do the ERCP, but it is on the schedule to do in the next 24 hours.  Recommendations were made.  Questions were answered.  They expressed understanding & appreciation.  -Disposition:  Disposition:  The patient is from: Home Anticipate discharge to:  Home Anticipated Date of Discharge is:  September 24,2025   Barriers to discharge:  Pending Clinical improvement (more likely than not), Therapy assessment & Recommendations pending, Need for inpatient procedure/study, Testing result pending, and Consultant clearance & sign off    Patient currently is NOT MEDICALLY STABLE for discharge from the hospital from a surgery standpoint.      I reviewed nursing notes, ED provider notes, Consultant GI notes, last 24 h vitals and pain scores, last 48 h intake and output, last 24 h labs and trends, and last 24 h imaging results.  I have reviewed this patient's available data, including medical history, events of note, test results, etc as part of my evaluation.   A significant portion of that time was spent in counseling. Care during the described time interval was provided  by me.  This care required moderate level of medical decision making.  10/24/2023    Subjective: (Chief complaint)  Patient with anxiety about pain.  Some relief with Dilaudid  1 mg but worried not enough.   On-call surgeon encouraged staff to use all pain medications.  He seemed better with extra Dilaudid  and oxycodone .  Worried about his pain but confesses it is under control.  None as needed.  Passing gas but no bowel movement.  Walking well in the hallways.  Objective:  Vital signs:  Vitals:   10/23/23 0558 10/23/23 2145 10/23/23 2200 10/24/23 0457  BP: (!) 142/96 (!) 142/96  (!) 132/90  Pulse: 98 (!) 110 96 (!) 101  Resp: 18 17  18   Temp: 98.8 F (37.1 C) 99 F (37.2 C)  98.2 F (36.8 C)  TempSrc:  Oral  Oral  SpO2: 99% 95%  97%  Weight:      Height:        Last BM Date : 10/18/23  Intake/Output   Yesterday:  09/22 0701 - 09/23 0700 In: 1846 [P.O.:460; I.V.:280.1; IV Piggyback:1105.9] Out: -  This shift:  No intake/output data recorded.  Bowel function:  Flatus: YES  BM:  No  Drain: (No drain)   Physical Exam:  General: Pt awake/alert in no acute distress.  Jaundiced Eyes: PERRL, normal EOM.  Sclera clear.  Some icterus Neuro: CN II-XII intact w/o focal sensory/motor deficits. Lymph: No head/neck/groin lymphadenopathy Psych:  No delerium/psychosis/paranoia.  Anxious and tearful but partially consolable.  Oriented x 4 HENT: Normocephalic, Mucus membranes moist.  No thrush Neck: Supple, No tracheal deviation.  No obvious thyromegaly Chest: No pain to chest wall compression.  Good respiratory excursion.  No audible wheezing CV:  Pulses intact.  Regular rhythm.  No major extremity edema MS: Normal AROM mjr joints.  No obvious deformity  Abdomen: Soft.  Mildy distended.  Mildly tender at incisions only.  Incisions clean dry and intact.   no evidence of peritonitis.  No incarcerated hernias.  Ext:  No deformity.  No mjr edema.  No cyanosis Skin: No petechiae / purpurea.  No major sores.  Warm and dry    Results:   FINAL MICROSCOPIC DIAGNOSIS:  A. GALLBLADDER, CHOLECYSTECTOMY:      Chronic cholecystitis.  B. LIVER, NEEDLE CORE  BIOPSY: Steatohepatitis with no fibrosis (Kleiner stage 0 / 4). See comment.  COMMENT:  The lobular hepatic architecture is preserved. The lobules reveal patchy macrovesicular steatosis accounting for approximately 5% of overall parenchymal volume. Ballooned hepatocytes are identified without distinct Mallory hyaline inclusions or acidophil bodies. Patchy lobular inflammation is seen. The portal tracts do not show evidence of significant portal inflammation, interlobular bile duct injury, bile ductular reaction, ductopenia or granulomas.  Trichrome stain does not show fibrosis. Iron stain demonstrates focal hepatocellular iron deposit (1+).  PAS/D stains reveal ceroid laden macrophages and is negative for diagnostic cytoplasmic globules. Reticulin stain reveal intact reticulin framework.  Overall, the histologic findings demonstrate mild steatohepatitis with no fibrosis. As you know, steatohepatitis is a pattern of injury that may be seen in association with a variety of conditions, including alcohol, metabolic syndrome, obesity, medication/drug-induced injury, Wilson's disease, among others. If non-alcoholic fatty liver disease (NAFLD) is clinically favored, the histologic findings correlate to an NAFLD Activity Score (NAS) of 3 of 8 (steatosis: 1 of 3; ballooning: 1 of 2; inflammation: 1 of 3) with Kleiner stage 0 of 4 fibrosis.   Cultures: No results found for this or any previous visit (  from the past 720 hours).  Labs: Results for orders placed or performed during the hospital encounter of 10/21/23 (from the past 48 hours)  Phosphorus     Status: Abnormal   Collection Time: 10/23/23  9:16 AM  Result Value Ref Range   Phosphorus 2.2 (L) 2.5 - 4.6 mg/dL    Comment: Performed at [facility], [address], [place], [place] [address]  Prealbumin     Status: Abnormal   Collection Time: 10/23/23  9:16 AM  Result Value Ref Range   Prealbumin 14 (L) 18 - 38  mg/dL    Comment: Performed at [facility], [address], [place], [place] [address]  Comprehensive metabolic panel with GFR     Status: Abnormal   Collection Time: 10/24/23  4:02 AM  Result Value Ref Range   Sodium 136 135 - 145 mmol/L   Potassium 4.0 3.5 - 5.1 mmol/L   Chloride 102 98 - 111 mmol/L   CO2 22 22 - 32 mmol/L   Glucose, Bld 92 70 - 99 mg/dL    Comment: Glucose reference range applies only to samples taken after fasting for at least 8 hours.   BUN 15 6 - 20 mg/dL   Creatinine, Ser 9.14 0.61 - 1.24 mg/dL    Comment: ICTERUS AT THIS LEVEL MAY AFFECT RESULT   Calcium  8.6 (L) 8.9 - 10.3 mg/dL   Total Protein 5.9 (L) 6.5 - 8.1 g/dL   Albumin 3.3 (L) 3.5 - 5.0 g/dL   AST 809 (H) 15 - 41 U/L    Comment: HEMOLYSIS AT THIS LEVEL MAY AFFECT RESULT   ALT 354 (H) 0 - 44 U/L   Alkaline Phosphatase 244 (H) 38 - 126 U/L   Total Bilirubin 6.5 (H) 0.0 - 1.2 mg/dL   GFR, Estimated >39 >39 mL/min    Comment: (NOTE) Calculated using the CKD-EPI Creatinine Equation (2021)    Anion gap 12 5 - 15    Comment: Performed at [facility], [address], [place], [place] [address]  CBC     Status: None   Collection Time: 10/24/23  4:02 AM  Result Value Ref Range   WBC 6.6 4.0 - 10.5 K/uL   RBC 4.29 4.22 - 5.81 MIL/uL   Hemoglobin 13.2 13.0 - 17.0 g/dL   HCT 58.6 60.9 - 47.9 %   MCV 96.3 80.0 - 100.0 fL   MCH 30.8 26.0 - 34.0 pg   MCHC 32.0 30.0 - 36.0 g/dL   RDW 87.1 88.4 - 84.4 %   Platelets 268 150 - 400 K/uL   nRBC 0.0 0.0 - 0.2 %    Comment: Performed at [facility], [address] [place], [place] [address]    Imaging / Studies: No results found.   Medications / Allergies: per chart  Antibiotics: Anti-infectives (From admission, onward)    Start     Dose/Rate Route Frequency Ordered Stop   10/23/23 0900  cefTRIAXone  (ROCEPHIN ) 2 g in sodium chloride  0.9 % 100 mL IVPB       Note to Pharmacy: Pharmacy may adjust dosing  strength / duration / interval for maximal efficacy   2 g 200 mL/hr over 30 Minutes Intravenous Every 24 hours 10/23/23 0756           Note: Portions of this report may have been transcribed using voice recognition software. Every effort was made to ensure accuracy; however, inadvertent computerized transcription errors may be present.  Any transcriptional errors that result from this process are unintentional.    Elspeth KYM Schultze, MD, FACS, MASCRS Esophageal, Gastrointestinal & Colorectal Surgery Robotic and Minimally Invasive Surgery  [facility] [address] [place], [place] [address] [phone] Fax [phone] Main  CONTACT INFORMATION: Weekday (9AM-5PM): Call [facility] main office at [phone] Weeknight (5PM-9AM) or Weekend/Holiday: Check EPIC Web Links tab & use AMION (password  [email or web address]) for General Surgery [facility] coverage  Please, DO NOT use SecureChat  (it is not reliable communication to reach operating surgeons & will lead to a delay in care).   Epic staff messaging available for outptient concerns needing 1-2 business day response.      10/24/2023  7:50 AM

## 2023-10-24 NOTE — Anesthesia Postprocedure Evaluation (Signed)
 Anesthesia Post Note  Patient: Edward Carpenter  Procedure(s) Performed: ERCP, WITH INTERVENTION IF INDICATED     Patient location during evaluation: Endoscopy Anesthesia Type: General Level of consciousness: awake and alert Pain management: pain level controlled Vital Signs Assessment: post-procedure vital signs reviewed and stable Respiratory status: spontaneous breathing, nonlabored ventilation, respiratory function stable and patient connected to nasal cannula oxygen Cardiovascular status: blood pressure returned to baseline and stable Postop Assessment: no apparent nausea or vomiting Anesthetic complications: no   No notable events documented.  Last Vitals:  Vitals:   10/24/23 1239 10/24/23 1250  BP: (!) 142/85 (!) 138/92  Pulse: 98 83  Resp: 15 15  Temp:  36.6 C  SpO2: 96% 99%    Last Pain:  Vitals:   10/24/23 1250  TempSrc: Oral  PainSc: 0-No pain                 Garnette FORBES Skillern

## 2023-10-24 NOTE — Plan of Care (Signed)

## 2023-10-25 ENCOUNTER — Encounter (HOSPITAL_COMMUNITY): Payer: Self-pay | Admitting: Gastroenterology

## 2023-10-25 LAB — COMPREHENSIVE METABOLIC PANEL WITH GFR
ALT: 288 U/L — ABNORMAL HIGH (ref 0–44)
AST: 109 U/L — ABNORMAL HIGH (ref 15–41)
Albumin: 3.5 g/dL (ref 3.5–5.0)
Alkaline Phosphatase: 239 U/L — ABNORMAL HIGH (ref 38–126)
Anion gap: 12 (ref 5–15)
BUN: 13 mg/dL (ref 6–20)
CO2: 23 mmol/L (ref 22–32)
Calcium: 9.1 mg/dL (ref 8.9–10.3)
Chloride: 105 mmol/L (ref 98–111)
Creatinine, Ser: 0.97 mg/dL (ref 0.61–1.24)
GFR, Estimated: >60 mL/min (ref >60)
Glucose, Bld: 95 mg/dL (ref 70–99)
Potassium: 4.4 mmol/L (ref 3.5–5.1)
Sodium: 140 mmol/L (ref 135–145)
Total Bilirubin: 3.4 mg/dL — ABNORMAL HIGH (ref 0.0–1.2)
Total Protein: 6.2 g/dL — ABNORMAL LOW (ref 6.5–8.1)

## 2023-10-25 LAB — CBC WITH DIFFERENTIAL/PLATELET
Abs Immature Granulocytes: 0.02 K/uL (ref 0.00–0.07)
Basophils Absolute: 0 K/uL (ref 0.0–0.1)
Basophils Relative: 0 %
Eosinophils Absolute: 0.1 K/uL (ref 0.0–0.5)
Eosinophils Relative: 2 %
HCT: 41.5 % (ref 39.0–52.0)
Hemoglobin: 13 g/dL (ref 13.0–17.0)
Immature Granulocytes: 0 %
Lymphocytes Relative: 10 %
Lymphs Abs: 0.6 K/uL — ABNORMAL LOW (ref 0.7–4.0)
MCH: 30.2 pg (ref 26.0–34.0)
MCHC: 31.3 g/dL (ref 30.0–36.0)
MCV: 96.3 fL (ref 80.0–100.0)
Monocytes Absolute: 0.7 K/uL (ref 0.1–1.0)
Monocytes Relative: 11 %
Neutro Abs: 4.7 K/uL (ref 1.7–7.7)
Neutrophils Relative %: 77 %
Platelets: 259 K/uL (ref 150–400)
RBC: 4.31 MIL/uL (ref 4.22–5.81)
RDW: 13 % (ref 11.5–15.5)
WBC: 6.1 K/uL (ref 4.0–10.5)
nRBC: 0 % (ref 0.0–0.2)

## 2023-10-25 NOTE — Discharge Summary (Signed)
 Physician Discharge Summary    Edward Carpenter MRN: [id number] DOB/AGE: 53-Sep-1972 = 53 y.o.  Patient Care Team: Allwardt, Mardy HERO, PA-C as PCP - General (Physician Assistant) Sheldon Standing, MD as Consulting Physician (General Surgery) Elicia Claw, MD as Consulting Physician (Gastroenterology)  Admit date: 10/21/2023  Discharge date: 10/25/2023  Hospital Stay = 2 days    Discharge Diagnoses:  Principal Problem:   Choledocholithiasis with obstruction Active Problems:   Anxiety   Irritable bowel syndrome   Chronic GERD   Hepatic steatosis   Elevated LFTs   Epigastric pain   Transaminitis   Biliary sludge  10/19/2023 POST-OPERATIVE DIAGNOSIS:    Chronic Cholecystitis   PROCEDURE:  Core Liver Biopsy (CPT code 52998) & SINGLE SITE Laparoscopic cholecystectomy with intraoperative cholangiogram (CPT code 52436)   SURGEON:  Standing KYM Sheldon, MD, FACS.   1 Day Post-Op  10/24/2023  POST-OPERATIVE DIAGNOSIS: Tight ampullary with common bile duct sludge and jaundice  Procedure(s):  ERCPsphincterotomy, balloon sweep   Surgeon(s): Rosalie Kitchens, MD  Consults: Case Management / Social Work, Anesthesia, and Gastroenterology  Hospital Course:   Patient biliary colic and suspected hyperkinesis and underwent cholecystectomy last week.  Pathology consistent with chronic cholecystitis.  Initially felt well but then 2 days prior to admission had severe pain that became more intense.  Came emergency department.  He was admitted to me.  CAT scan showed no concerning pneumoperitoneum leaks or other abdominal issues.  HIDA scan showed no obvious leak but concern for some common bile duct obstruction.  MRCP raise concern of distal choledocholithiasis sludge but no major dilatation.  Gastroenterology was consulted.  On-call gastroenterologist did EGD EUS and saw some probable sludge in the distal common bile duct.  However he did not have time to do an ERCP.  Patient not markedly more  jaundice with markedly worse pain.  2 days later Gastroenterology was able to do an ERCP.  Difficult cannulization of the ampulla but sphincterotomy was done and balloon sweep out sludge.  The next day patient felt markedly better with pain totally resolved.  Liver function test markedly improved from bilirubin of as high as 8 down to 3.  Walked in the hallway smiling.  By the time of discharge, the patient was walking well the hallways, eating food, having flatus.  Pain was well-controlled on an oral medications.  Based on meeting discharge criteria and continuing to recover, I felt it was safe for the patient to be discharged from the hospital to further recover with close followup. Postoperative recommendations were discussed in detail.  They are written as well.  Discharged Condition: good  Discharge Exam: Blood pressure (!) 148/99, pulse 86, temperature 97.7 F (36.5 C), temperature source Oral, resp. rate 18, height 5' 8 (1.727 m), weight 72.1 kg, SpO2 99%.  General: Pt awake/alert/oriented x4 in No acute distress.  Smiling Eyes: PERRL, normal EOM.  Sclera clear.  No icterus Neuro: CN II-XII intact w/o focal sensory/motor deficits. Lymph: No head/neck/groin lymphadenopathy Psych:  No delerium/psychosis/paranoia HENT: Normocephalic, Mucus membranes moist.  No thrush Neck: Supple, No tracheal deviation Chest: No chest wall pain w good excursion CV:  Pulses intact.  Regular rhythm MS: Normal AROM mjr joints.  No obvious deformity.  Walking in hallways with no guarding or hesitancy Abdomen: Soft.  Nondistended.  Nontender.  No evidence of peritonitis.  No incarcerated hernias. Ext:  SCDs BLE.  No mjr edema.  No cyanosis Skin: No petechiae / purpura   Disposition:    Follow-up Information  Sheldon Standing, MD Follow up in 3 week(s).   Specialties: General Surgery, Colon and Rectal Surgery Why: To follow up after your hospital stay Contact information: [address] [place] [address] [phone]                 Discharge disposition: 01-Home or Self Care       Discharge Instructions     Call MD for:   Complete by: As directed    FEVER > 101.5 F  (temperatures < 101.5 F are not significant)   Call MD for:  extreme fatigue   Complete by: As directed    Call MD for:  persistant dizziness or light-headedness   Complete by: As directed    Call MD for:  persistant nausea and vomiting   Complete by: As directed    Call MD for:  redness, tenderness, or signs of infection (pain, swelling, redness, odor or green/yellow discharge around incision site)   Complete by: As directed    Call MD for:  severe uncontrolled pain   Complete by: As directed    Diet - low sodium heart healthy   Complete by: As directed    Start with a bland diet such as soups, liquids, starchy foods, low fat foods, etc. the first few days at home. Gradually advance to a solid, low-fat, high fiber diet by the end of the first week at home.   Add a fiber supplement to your diet (Metamucil, etc) If you feel full, bloated, or constipated, stay on a full liquid or pureed/blenderized diet for a few days until you feel better and are no longer constipated.   Discharge instructions   Complete by: As directed    See Discharge Instructions If you are not getting better after two weeks or are noticing you are getting worse, contact our office [phone] for further advice.  We may need to adjust your medications, re-evaluate you in the office, send you to the emergency room, or see what other things we can do to help. The clinic staff is available to answer your questions during regular business hours (8:30am-5pm).  Please don't hesitate to call and ask to speak to one of our nurses for clinical concerns.    A surgeon from [facility] is always on call at the hospitals 24 hours/day If you have a medical emergency, go to the nearest emergency room or call  911.   Discharge wound care:   Complete by: As directed    It is good for closed incisions and even open wounds to be washed every day.  Shower every day.  Short baths are fine.  Wash the incisions and wounds clean with soap & water.    You may leave closed incisions open to air if it is dry.   You may cover the incision with clean gauze & replace it after your daily shower for comfort.   Driving Restrictions   Complete by: As directed    You may drive when: - you are no longer taking narcotic prescription pain medication - you can comfortably wear a seatbelt - you can safely make sudden turns/stops without pain.   Increase activity slowly   Complete by: As directed    Start light daily activities --- self-care, walking, climbing stairs- beginning the day after surgery.  Gradually increase activities as tolerated.  Control your pain to be active.  Stop when you are tired.  Ideally, walk several times a day,  eventually an hour a day.   Most people are back to most day-to-day activities in a few weeks.  It takes 4-6 weeks to get back to unrestricted, intense activity. If you can walk 30 minutes without difficulty, it is safe to try more intense activity such as jogging, treadmill, bicycling, low-impact aerobics, swimming, etc. Save the most intensive and strenuous activity for last (Usually 4-8 weeks after surgery) such as sit-ups, heavy lifting, contact sports, etc.  Refrain from any intense heavy lifting or straining until you are off narcotics for pain control.  You will have off days, but things should improve week-by-week. DO NOT PUSH THROUGH PAIN.  Let pain be your guide: If it hurts to do something, don't do it.   Lifting restrictions   Complete by: As directed    If you can walk 30 minutes without difficulty, it is safe to try more intense activity such as jogging, treadmill, bicycling, low-impact aerobics, swimming, etc. Save the most intensive and strenuous activity for last (Usually 4-8  weeks after surgery) such as sit-ups, heavy lifting, contact sports, etc.   Refrain from any intense heavy lifting or straining until you are off narcotics for pain control.  You will have off days, but things should improve week-by-week. DO NOT PUSH THROUGH PAIN.  Let pain be your guide: If it hurts to do something, don't do it.  Pain is your body warning you to avoid that activity for another week until the pain goes down.   May shower / Bathe   Complete by: As directed    May walk up steps   Complete by: As directed    Sexual Activity Restrictions   Complete by: As directed    You may have sexual intercourse when it is comfortable. If it hurts to do something, stop.       Allergies as of 10/25/2023       Reactions   Prednisone Other (See Comments)   syncope        Medication List     TAKE these medications    acetaminophen  500 MG tablet Commonly known as: TYLENOL  Take 500 mg by mouth every 6 (six) hours as needed for mild pain (pain score 1-3) or moderate pain (pain score 4-6).   clonazePAM  0.5 MG tablet Commonly known as: KLONOPIN  TAKE 1/2 TO 1 TABLET BY MOUTH EVERY DAY AT BEDTIME FOR SLEEP AND ANXIETY What changed: See the new instructions.   ondansetron  4 MG tablet Commonly known as: ZOFRAN  Take 1 tablet (4 mg total) by mouth every 8 (eight) hours as needed for nausea.   pantoprazole  40 MG tablet Commonly known as: PROTONIX  Take 40 mg by mouth daily as needed (heartburn).   traMADol  50 MG tablet Commonly known as: ULTRAM  Take 1-2 tablets (50-100 mg total) by mouth every 6 (six) hours as needed for moderate pain (pain score 4-6) or severe pain (pain score 7-10).               Discharge Care Instructions  (From admission, onward)           Start     Ordered   10/25/23 0000  Discharge wound care:       Comments: It is good for closed incisions and even open wounds to be washed every day.  Shower every day.  Short baths are fine.  Wash the incisions  and wounds clean with soap & water.    You may leave closed incisions open to air if it  is dry.   You may cover the incision with clean gauze & replace it after your daily shower for comfort.   10/25/23 0809            Significant Diagnostic Studies:  Results for orders placed or performed during the hospital encounter of 10/21/23 (from the past 72 hours)  Surgical pathology     Status: None   Collection Time: 10/22/23 12:27 PM  Result Value Ref Range   SURGICAL PATHOLOGY      SURGICAL PATHOLOGY CASE: [id number] PATIENT: GARNETTE PIN Surgical Pathology Report     Clinical History: jaundice, rule out biliary obstruction     FINAL MICROSCOPIC DIAGNOSIS:  A. RANDOM GASTRIC BIOPSIES: Reactive gastropathy with minimal chronic gastritis and lymphoid aggregate Negative for H. pylori, intestinal metaplasia, dysplasia and carcinoma   Jacey Eckerson DESCRIPTION:  A. Received in formalin labeled with the patients name and Random gastric biopsies are six 0.2-0.6 cm pieces of tan soft tissue, submitted in toto in a single cassette.  (LEF 10/23/2023)   Final Diagnosis performed by Prentice Pitcher, MD.   Electronically signed 10/24/2023 Technical component performed at [facility], [address], [place], [place] [address].  Professional component performed at [employer]. [facility], [address], [place], [place] [address].  Immunohistochemistry Technical component (if applicable) was performed at [facility]. [address], [place], [place] [address].   IMMUNOHISTOCHEMISTRY DISCLAIMER (if applicable): Some of these immunohistochemical stains may have been developed and the performance characteristics determine by [facility]. Some may not have been cleared or approved by the U.S. Food and Drug Administration. The FDA has determined that such clearance or approval is not necessary. This test is used  for clinical purposes. It should not be regarded as investigational or for research. This laboratory is certified under the Clinical Laboratory Improvement Amendments of 1988 (CLIA-88) as qualified to perform high complexity clinical laboratory testing.  The controls stained appropriately.   IHC stains are performed on formalin fixed, paraffin embedded tissue using a 3,3diaminobenzidine (DAB) chromogen and Leica Bond Autostainer System. The staining intensity of the nucleus is score manually and is reported as the percentage of tumor cel l nuclei demonstrating specific nuclear staining. The specimens are fixed in 10% Neutral Formalin for at least 6 hours and up to 72hrs. These tests are validated on decalcified tissue. Results should be interpreted with caution given the possibility of false negative results on decalcified specimens. Antibody Clones are as follows ER-clone 66F, PR-clone 16, Ki67- clone MM1. Some of these immunohistochemical stains may have been developed and the performance characteristics determined by [facility].   Phosphorus     Status: Abnormal   Collection Time: 10/23/23  9:16 AM  Result Value Ref Range   Phosphorus 2.2 (L) 2.5 - 4.6 mg/dL    Comment: Performed at [facility], [address], [place], [place] [address]  Prealbumin     Status: Abnormal   Collection Time: 10/23/23  9:16 AM  Result Value Ref Range   Prealbumin 14 (L) 18 - 38 mg/dL    Comment: Performed at [facility], [address], [place], [place] [address]  Comprehensive metabolic panel with GFR     Status: Abnormal   Collection Time: 10/24/23  4:02 AM  Result Value Ref Range   Sodium 136 135 - 145 mmol/L   Potassium 4.0 3.5 - 5.1 mmol/L   Chloride 102 98 - 111 mmol/L  CO2 22 22 - 32 mmol/L   Glucose, Bld 92 70 - 99 mg/dL    Comment: Glucose reference range applies only to samples taken after fasting for at least 8 hours.   BUN 15 6 - 20 mg/dL    Creatinine, Ser 9.14 0.61 - 1.24 mg/dL    Comment: ICTERUS AT THIS LEVEL MAY AFFECT RESULT   Calcium  8.6 (L) 8.9 - 10.3 mg/dL   Total Protein 5.9 (L) 6.5 - 8.1 g/dL   Albumin 3.3 (L) 3.5 - 5.0 g/dL   AST 809 (H) 15 - 41 U/L    Comment: HEMOLYSIS AT THIS LEVEL MAY AFFECT RESULT   ALT 354 (H) 0 - 44 U/L   Alkaline Phosphatase 244 (H) 38 - 126 U/L   Total Bilirubin 6.5 (H) 0.0 - 1.2 mg/dL   GFR, Estimated >39 >39 mL/min    Comment: (NOTE) Calculated using the CKD-EPI Creatinine Equation (2021)    Anion gap 12 5 - 15    Comment: Performed at [facility], [address] [place], [place] [address]  CBC     Status: None   Collection Time: 10/24/23  4:02 AM  Result Value Ref Range   WBC 6.6 4.0 - 10.5 K/uL   RBC 4.29 4.22 - 5.81 MIL/uL   Hemoglobin 13.2 13.0 - 17.0 g/dL   HCT 58.6 60.9 - 47.9 %   MCV 96.3 80.0 - 100.0 fL   MCH 30.8 26.0 - 34.0 pg   MCHC 32.0 30.0 - 36.0 g/dL   RDW 87.1 88.4 - 84.4 %   Platelets 268 150 - 400 K/uL   nRBC 0.0 0.0 - 0.2 %    Comment: Performed at [facility], [address] [place], [place] [address]  Comprehensive metabolic panel with GFR     Status: Abnormal   Collection Time: 10/25/23  4:58 AM  Result Value Ref Range   Sodium 140 135 - 145 mmol/L   Potassium 4.4 3.5 - 5.1 mmol/L   Chloride 105 98 - 111 mmol/L   CO2 23 22 - 32 mmol/L   Glucose, Bld 95 70 - 99 mg/dL    Comment: Glucose reference range applies only to samples taken after fasting for at least 8 hours.   BUN 13 6 - 20 mg/dL   Creatinine, Ser 9.02 0.61 - 1.24 mg/dL   Calcium  9.1 8.9 - 10.3 mg/dL   Total Protein 6.2 (L) 6.5 - 8.1 g/dL   Albumin 3.5 3.5 - 5.0 g/dL   AST 890 (H) 15 - 41 U/L   ALT 288 (H) 0 - 44 U/L   Alkaline Phosphatase 239 (H) 38 - 126 U/L   Total Bilirubin 3.4 (H) 0.0 - 1.2 mg/dL   GFR, Estimated >39 >39 mL/min    Comment: (NOTE) Calculated using the CKD-EPI Creatinine Equation (2021)    Anion gap 12 5 - 15    Comment:  Performed at [facility], [address] [place], [place] [address]  CBC with Differential/Platelet     Status: Abnormal   Collection Time: 10/25/23  4:58 AM  Result Value Ref Range   WBC 6.1 4.0 - 10.5 K/uL   RBC 4.31 4.22 - 5.81 MIL/uL   Hemoglobin 13.0 13.0 - 17.0 g/dL   HCT 58.4 60.9 - 47.9 %   MCV 96.3 80.0 - 100.0 fL   MCH 30.2 26.0 - 34.0 pg   MCHC 31.3 30.0 - 36.0 g/dL   RDW 86.9 88.4 -  15.5 %   Platelets 259 150 - 400 K/uL   nRBC 0.0 0.0 - 0.2 %   Neutrophils Relative % 77 %   Neutro Abs 4.7 1.7 - 7.7 K/uL   Lymphocytes Relative 10 %   Lymphs Abs 0.6 (L) 0.7 - 4.0 K/uL   Monocytes Relative 11 %   Monocytes Absolute 0.7 0.1 - 1.0 K/uL   Eosinophils Relative 2 %   Eosinophils Absolute 0.1 0.0 - 0.5 K/uL   Basophils Relative 0 %   Basophils Absolute 0.0 0.0 - 0.1 K/uL   Immature Granulocytes 0 %   Abs Immature Granulocytes 0.02 0.00 - 0.07 K/uL    Comment: Performed at [facility], [address] [place], [place] [address]    MR ABDOMEN WITH MRCP W CONTRAST Result Date: 10/21/2023 CLINICAL DATA:  Abdominal pain. Patient is 2 days status post cholecystectomy. EXAM: MRI ABDOMEN WITH CONTRAST (WITH MRCP) TECHNIQUE: Multiplanar multisequence MR imaging of the abdomen was performed following the administration of intravenous contrast. Heavily T2-weighted images of the biliary and pancreatic ducts were obtained, and three-dimensional MRCP images were rendered by post processing. CONTRAST:  7mL GADAVIST  GADOBUTROL  1 MMOL/ML IV SOLN COMPARISON:  Nuclear medicine hepatobiliary scan, same date. FINDINGS: Lower chest: Small bilateral pleural effusions and streaky bibasilar atelectasis. No pericardial effusion. Hepatobiliary: No hepatic lesions or intrahepatic biliary dilatation. Normal caliber and course of the common bile duct. No common bile duct stones are identified. Pancreas:  No mass, inflammation or ductal dilatation. Spleen:  Within normal  limits in size and appearance. Adrenals/Urinary Tract:  Adrenal glands and kidneys unremarkable. Stomach/Bowel: The stomach, duodenum, visualized small bowel and colon are unremarkable. Moderate gaseous distension of the visualized colon may suggest a postoperative ileus. No small bowel dilatation. Vascular/Lymphatic: No pathologically enlarged lymph nodes identified. No abdominal aortic aneurysm demonstrated. Other: Small amount of free air is noted and not unexpected this soon after surgery. There is also small amount of fluid/inflammation/edema noted in the gallbladder fossa and in the periportal region and anterior pararenal space all considered normal given the recent surgery. No large fluid collection to suggest a biloma or hematoma. Musculoskeletal: No significant bony findings. IMPRESSION: 1. Status post cholecystectomy. Expected postoperative changes as above. No large fluid collection to suggest a biloma or hematoma. 2. Normal caliber and course of the common bile duct. No common bile duct stones are identified. 3. Small amount of free air and fluid/inflammation/edema in the gallbladder fossa and in the periportal region and anterior pararenal space all considered normal given the recent surgery. 4. Small bilateral pleural effusions and streaky bibasilar atelectasis. 5. Moderate gaseous distension of the visualized colon may suggest a postoperative ileus. Electronically Signed   By: MYRTIS Stammer M.D.   On: 10/21/2023 20:12   MR 3D Recon At Scanner Result Date: 10/21/2023 CLINICAL DATA:  Abdominal pain. Patient is 2 days status post cholecystectomy. EXAM: MRI ABDOMEN WITH CONTRAST (WITH MRCP) TECHNIQUE: Multiplanar multisequence MR imaging of the abdomen was performed following the administration of intravenous contrast. Heavily T2-weighted images of the biliary and pancreatic ducts were obtained, and three-dimensional MRCP images were rendered by post processing. CONTRAST:  7mL GADAVIST  GADOBUTROL  1  MMOL/ML IV SOLN COMPARISON:  Nuclear medicine hepatobiliary scan, same date. FINDINGS: Lower chest: Small bilateral pleural effusions and streaky bibasilar atelectasis. No pericardial effusion. Hepatobiliary: No hepatic lesions or intrahepatic biliary dilatation. Normal caliber and course of the common bile duct. No common bile duct stones are identified. Pancreas:  No mass, inflammation  or ductal dilatation. Spleen:  Within normal limits in size and appearance. Adrenals/Urinary Tract:  Adrenal glands and kidneys unremarkable. Stomach/Bowel: The stomach, duodenum, visualized small bowel and colon are unremarkable. Moderate gaseous distension of the visualized colon may suggest a postoperative ileus. No small bowel dilatation. Vascular/Lymphatic: No pathologically enlarged lymph nodes identified. No abdominal aortic aneurysm demonstrated. Other: Small amount of free air is noted and not unexpected this soon after surgery. There is also small amount of fluid/inflammation/edema noted in the gallbladder fossa and in the periportal region and anterior pararenal space all considered normal given the recent surgery. No large fluid collection to suggest a biloma or hematoma. Musculoskeletal: No significant bony findings. IMPRESSION: 1. Status post cholecystectomy. Expected postoperative changes as above. No large fluid collection to suggest a biloma or hematoma. 2. Normal caliber and course of the common bile duct. No common bile duct stones are identified. 3. Small amount of free air and fluid/inflammation/edema in the gallbladder fossa and in the periportal region and anterior pararenal space all considered normal given the recent surgery. 4. Small bilateral pleural effusions and streaky bibasilar atelectasis. 5. Moderate gaseous distension of the visualized colon may suggest a postoperative ileus. Electronically Signed   By: MYRTIS Stammer M.D.   On: 10/21/2023 20:12   NM HEPATOBILIARY LEAK (POST-SURGICAL) Result Date:  10/21/2023 CLINICAL DATA:  Two days postop from cholecystectomy. Severe right upper quadrant pain. Evaluate for postop bile leak. EXAM: NUCLEAR MEDICINE HEPATOBILIARY IMAGING TECHNIQUE: Sequential images of the abdomen were obtained out to 120 minutes following intravenous administration of radiopharmaceutical. RADIOPHARMACEUTICALS:  7.4 mCi Tc-30m  Choletec  IV COMPARISON:  None Available. FINDINGS: Prompt uptake of activity by the liver is seen. No biliary excretion of radiopharmaceutical activity is seen. This is suspicious for biliary obstruction, although hepatocellular dysfunction is also a consideration. Given lack of biliary excretion, postop bile leak cannot be excluded. IMPRESSION: Lack of biliary excretion seen, highly suspicious for biliary obstruction, with hepatocellular dysfunction considered less likely. Postop bile leak cannot be excluded on this exam. Electronically Signed   By: Norleen DELENA Kil M.D.   On: 10/21/2023 12:49   CT Angio Chest Pulmonary Embolism (PE) W or WO Contrast Result Date: 10/21/2023 CLINICAL DATA:  Epigastric pain and shortness of breath and nausea. Clinical concern for pulmonary embolus. EXAM: CT ANGIOGRAPHY CHEST WITH CONTRAST TECHNIQUE: Multidetector CT imaging of the chest was performed using the standard protocol during bolus administration of intravenous contrast. Multiplanar CT image reconstructions and MIPs were obtained to evaluate the vascular anatomy. RADIATION DOSE REDUCTION: This exam was performed according to the departmental dose-optimization program which includes automated exposure control, adjustment of the mA and/or kV according to patient size and/or use of iterative reconstruction technique. CONTRAST:  75mL OMNIPAQUE  IOHEXOL  350 MG/ML SOLN COMPARISON:  07/01/2019 FINDINGS: Cardiovascular: The heart size is normal. No substantial pericardial effusion. No thoracic aortic aneurysm. No substantial atherosclerotic calcification of the thoracic aorta. There is no  filling defect within the opacified pulmonary arteries to suggest the presence of an acute pulmonary embolus. Mediastinum/Nodes: No mediastinal lymphadenopathy. There is no hilar lymphadenopathy. The esophagus has normal imaging features. There is no axillary lymphadenopathy. Lungs/Pleura: Calcified granuloma noted right upper lobe. Dependent collapse/consolidation noted in both lung bases, right greater than left. No pulmonary edema or substantial pleural effusion. Upper Abdomen: 13 mm hypervascular lesion is seen in the dome of the liver, not evident on previous chest CTA or abdomen CT of 12/29/2021. Free gas is identified anterior to the liver. Musculoskeletal: No worrisome  lytic or sclerotic osseous abnormality. Review of the MIP images confirms the above findings. IMPRESSION: 1. No CT evidence for acute pulmonary embolus. 2. Dependent collapse/consolidation in both lung bases, right greater than left. Imaging features likely reflect dependent atelectasis. 3. Free gas anterior to the liver. This is compatible with the recent surgery on 10/19/2023. 4. 13 mm hypervascular lesion in the dome of the liver, not evident on previous chest CTA or abdomen CT of 12/29/2021. This is probably a benign lesion such as a flash filling hemangioma, FNH or vascular malformation. Follow-up MRI of the abdomen with and without contrast recommended to further evaluate. MRI of the abdomen should be deferred until the patient recovers from acute symptoms and can best participate with positioning and reproducible breath holding. Motion degraded study may well be nondiagnostic. Electronically Signed   By: Camellia Candle M.D.   On: 10/21/2023 09:27   CT ABDOMEN PELVIS W CONTRAST Result Date: 10/21/2023 CLINICAL DATA:  The patient had a laparoscopic cholecystectomy, intraoperative cholangiogram and liver biopsy 2 days ago, now with severe right upper abdominal pain. EXAM: CT ABDOMEN AND PELVIS WITH CONTRAST TECHNIQUE: Multidetector CT  imaging of the abdomen and pelvis was performed using the standard protocol following bolus administration of intravenous contrast. RADIATION DOSE REDUCTION: This exam was performed according to the departmental dose-optimization program which includes automated exposure control, adjustment of the mA and/or kV according to patient size and/or use of iterative reconstruction technique. CONTRAST:  OMNIPAQUE  IOHEXOL  300 MG/ML  SOLN COMPARISON:  CTs with IV contrast 12/29/2021 and 08/07/2017. FINDINGS: Lower chest: There are trace pleural effusions. Increased opacity in the posterior base of the lungs right-greater-than-left is most likely due to hypostatic/dependent atelectasis but not seen previously. Underlying infection is possible. Rest of the lung bases are clear. The cardiac size is normal. Hepatobiliary: Status post cholecystectomy with scattered nonlocalizing fluid and stranding in the gallbladder fossa, probably a normal postoperative finding. Inflammatory process or bile leak not strictly excluded. There is new capsular irregularity over the anterolateral aspect of segment 8. Anterolaterally in segment 8 there is a 4 cm in length linear hypodensity extending from the capsule into the parenchyma on 2:21. This could be the site of the patient's liver biopsy, otherwise, this could be operative trauma to the liver with a linear grade 3 laceration. There is a small volume of overlying low-density perihepatic fluid, but I would expect a larger amount of higher density perihepatic hemorrhage if this was a grade 3 laceration. Correlation with the operative report is recommended. Rest of the liver is slightly steatotic, and is homogeneous without mass enhancement. Pancreas: No abnormality. Spleen: No abnormality Adrenals/Urinary Tract: No abnormality. Stomach/Bowel: No dilatation or wall thickening including the appendix. Moderate retained stool ascending colon. Advanced sigmoid diverticulosis without acute  diverticulitis. Vascular/Lymphatic: No significant vascular findings are present. No enlarged abdominal or pelvic lymph nodes. Reproductive: Prostate is unremarkable. Other: Open wound midline anterior mid abdominal wall, mild underlying stranding. Trace air noted between muscular layers in the anterior right abdominal wall. Small volume of low-density anterior perihepatic fluid, small amount of associated perihepatic intraperitoneal air, suspect residual postoperative change. Free air is not seen elsewhere. There is trace ascites in posterior pelvis. No encapsulated fluid is seen. Small inguinal fat hernias. Musculoskeletal: No acute or significant osseous findings. IMPRESSION: 1. Status post cholecystectomy with scattered nonlocalizing fluid and stranding in the gallbladder fossa, probably a normal postoperative finding. Inflammatory process or bile leak not strictly excluded. 2. 4 cm in length linear  hypodensity extending from the capsule into the parenchyma of segment 8 of the liver. This could be the site of the patient's liver biopsy, otherwise, this could be operative trauma to the liver with a linear grade 3 laceration. Correlation with the operative report is recommended. 3. Small volume of low-density perihepatic fluid, small amount of associated perihepatic intraperitoneal air, suspect residual postoperative change. Free air is not seen elsewhere. There is no space-occupying perihepatic or subcapsular intrahepatic hematoma. 4. Trace ascites in the posterior pelvis. No encapsulated fluid is seen. 5. Trace pleural effusions with increased opacity in the posterior base of the lungs right-greater-than-left, most likely due to hypostatic/dependent atelectasis but not seen previously. Underlying infection is possible. 6. Open wound midline anterior mid abdominal wall with mild underlying stranding. 7. Constipation and diverticulosis. Electronically Signed   By: Francis Quam M.D.   On: 10/21/2023 05:14     Past Medical History:  Diagnosis Date   Allergy    Anxiety 2010   Chronic cholecystitis 10/19/2023   Depression    Diverticulitis    Family history of adverse reaction to anesthesia    mother cannot go under due to coginetal defect   GERD (gastroesophageal reflux disease)     Past Surgical History:  Procedure Laterality Date   arm surgery Right    reconstruction   CHOLECYSTECTOMY N/A 10/19/2023   Procedure: LAPAROSCOPIC CHOLECYSTECTOMY WITH INTRAOPERATIVE CHOLANGIOGRAM;  Surgeon: Sheldon Standing, MD;  Location: [facility];  Service: General;  Laterality: N/A;  SINGLE SITE   COLONOSCOPY     x2   ESOPHAGOGASTRODUODENOSCOPY N/A 10/22/2023   Procedure: EGD (ESOPHAGOGASTRODUODENOSCOPY);  Surgeon: Wilhelmenia Aloha Raddle., MD;  Location: [facility];  Service: Gastroenterology;  Laterality: N/A;   EUS N/A 10/22/2023   Procedure: ULTRASOUND, UPPER GI TRACT, ENDOSCOPIC;  Surgeon: Wilhelmenia Aloha Raddle., MD;  Location: [facility];  Service: Gastroenterology;  Laterality: N/A;   LIVER BIOPSY N/A 10/19/2023   Procedure: BIOPSY, LIVER;  Surgeon: Sheldon Standing, MD;  Location: [facility];  Service: General;  Laterality: N/A;  POSSIBLE NEEDLE CORE BIOPSY OF LIVER    Social History   Socioeconomic History   Marital status: Married    Spouse name: Not on file   Number of children: Not on file   Years of education: Not on file   Highest education level: Bachelor's degree (e.g., BA, AB, BS)  Occupational History   Not on file  Tobacco Use   Smoking status: Never   Smokeless tobacco: Never  Vaping Use   Vaping status: Never Used  Substance and Sexual Activity   Alcohol use: Yes    Alcohol/week: 4.0 standard drinks of alcohol    Types: 4 Glasses of wine per week   Drug use: No   Sexual activity: Yes    Birth control/protection: Condom  Other Topics Concern   Not on file  Social History Narrative   Media planner at [employer]    Social Drivers of Health   Financial Resource Strain:  Medium Risk (03/12/2023)   Overall Financial Resource Strain (CARDIA)    Difficulty of Paying Living Expenses: Somewhat hard  Food Insecurity: Food Insecurity Present (10/21/2023)   Hunger Vital Sign    Worried About Running Out of Food in the Last Year: Sometimes true    Ran Out of Food in the Last Year: Sometimes true  Transportation Needs: No Transportation Needs (10/21/2023)   PRAPARE - Administrator, Civil Service (Medical): No    Lack of Transportation (Non-Medical): No  Physical  Activity: Insufficiently Active (03/12/2023)   Exercise Vital Sign    Days of Exercise per Week: 1 day    Minutes of Exercise per Session: 20 min  Stress: Stress Concern Present (03/12/2023)   [employer] of Occupational Health - Occupational Stress Questionnaire    Feeling of Stress : Very much  Social Connections: Unknown (03/12/2023)   Social Connection and Isolation Panel    Frequency of Communication with Friends and Family: Once a week    Frequency of Social Gatherings with Friends and Family: Patient declined    Attends Religious Services: Never    Database administrator or Organizations: No    Attends Engineer, structural: Not on file    Marital Status: Married  Catering manager Violence: Not At Risk (10/21/2023)   Humiliation, Afraid, Rape, and Kick questionnaire    Fear of Current or Ex-Partner: No    Emotionally Abused: No    Physically Abused: No    Sexually Abused: No    Family History  Problem Relation Age of Onset   Valvular heart disease Mother    Anxiety disorder Mother    Stroke Father    Anxiety disorder Sister     Current Facility-Administered Medications  Medication Dose Route Frequency Provider Last Rate Last Admin   acetaminophen  (TYLENOL ) tablet 1,000 mg  1,000 mg Oral Q6H Rosalie Kitchens, MD   1,000 mg at 10/25/23 0541   alum & mag hydroxide-simeth (MAALOX/MYLANTA) 200-200-20 MG/5ML suspension 30 mL  30 mL Oral Q6H PRN Rosalie Kitchens, MD       bisacodyl   (DULCOLAX) suppository 10 mg  10 mg Rectal Q12H PRN Rosalie Kitchens, MD       cefTRIAXone  (ROCEPHIN ) 2 g in sodium chloride  0.9 % 100 mL IVPB  2 g Intravenous Q24H Rosalie Kitchens, MD   Stopping previously hung infusion at 10/24/23 1246   clonazePAM  (KLONOPIN ) tablet 0.5-1 mg  0.5-1 mg Oral TID PRN Rosalie Kitchens, MD   0.5 mg at 10/24/23 2203   diphenhydrAMINE  (BENADRYL ) 12.5 MG/5ML elixir 12.5 mg  12.5 mg Oral Q6H PRN Rosalie Kitchens, MD       diphenhydrAMINE  (BENADRYL ) injection 12.5-25 mg  12.5-25 mg Intravenous Q6H PRN Rosalie Kitchens, MD       enoxaparin  (LOVENOX ) injection 40 mg  40 mg Subcutaneous Q24H Magod, Kitchens, MD       HYDROmorphone  (DILAUDID ) injection 0.5-2 mg  0.5-2 mg Intravenous Q2H PRN Rosalie Kitchens, MD   1 mg at 10/24/23 0741   ketorolac  (TORADOL ) 15 MG/ML injection 15 mg  15 mg Intravenous Once Rosalie Kitchens, MD       magic mouthwash  15 mL Oral QID PRN Rosalie Kitchens, MD       melatonin tablet 3 mg  3 mg Oral QHS PRN Rosalie Kitchens, MD       menthol  (CEPACOL) lozenge 3 mg  1 lozenge Oral PRN Rosalie Kitchens, MD       methocarbamol  (ROBAXIN ) injection 1,000 mg  1,000 mg Intravenous Q6H PRN Rosalie Kitchens, MD   1,000 mg at 10/23/23 1819   methocarbamol  (ROBAXIN ) tablet 1,000 mg  1,000 mg Oral Q6H PRN Rosalie Kitchens, MD       metoprolol  tartrate (LOPRESSOR ) injection 5 mg  5 mg Intravenous Q6H PRN Rosalie Kitchens, MD       naphazoline-glycerin  (CLEAR EYES REDNESS) ophth solution 1-2 drop  1-2 drop Both Eyes QID PRN Rosalie Kitchens, MD       ondansetron  (ZOFRAN -ODT) disintegrating tablet 4 mg  4 mg Oral Q6H PRN Rosalie Kitchens, MD       Or   ondansetron  (ZOFRAN ) injection 4 mg  4 mg Intravenous Q6H PRN Rosalie Kitchens, MD   4 mg at 10/24/23 0747   ondansetron  (ZOFRAN ) tablet 4 mg  4 mg Oral Q8H PRN Rosalie Kitchens, MD       oxyCODONE  (Oxy IR/ROXICODONE ) immediate release tablet 5-10 mg  5-10 mg Oral Q4H PRN Rosalie Kitchens, MD   10 mg at 10/24/23 1749   pantoprazole  (PROTONIX ) EC tablet 40 mg  40 mg Oral Daily PRN Rosalie Kitchens, MD        pantoprazole  (PROTONIX ) EC tablet 40 mg  40 mg Oral BID AC Magod, Marc, MD   40 mg at 10/24/23 1749   phenol (CHLORASEPTIC) mouth spray 2 spray  2 spray Mouth/Throat PRN Rosalie Kitchens, MD       polycarbophil (FIBERCON) tablet 625 mg  625 mg Oral BID Rosalie Kitchens, MD   625 mg at 10/24/23 2042   polyethylene glycol (MIRALAX  / GLYCOLAX ) packet 17 g  17 g Oral Daily Rosalie Kitchens, MD   17 g at 10/23/23 1014   prochlorperazine  (COMPAZINE ) injection 5-10 mg  5-10 mg Intravenous Q4H PRN Rosalie Kitchens, MD   10 mg at 10/23/23 1908   prochlorperazine  (COMPAZINE ) tablet 10 mg  10 mg Oral Q6H PRN Rosalie Kitchens, MD       simethicone  (MYLICON) 40 MG/0.6ML suspension 80 mg  80 mg Oral QID PRN Rosalie Kitchens, MD       sodium chloride  (OCEAN) 0.65 % nasal spray 1-2 spray  1-2 spray Each Nare Q6H PRN Rosalie Kitchens, MD         Allergies  Allergen Reactions   Prednisone Other (See Comments)    syncope    Signed:   Elspeth KYM Schultze, MD, FACS, MASCRS Esophageal, Gastrointestinal & Colorectal Surgery Robotic and Minimally Invasive Surgery  [facility] [address], [address] [place], [place] [address] [phone] Fax [phone] Main  CONTACT INFORMATION: Weekday (9AM-5PM): Call [facility] main office at [phone] Weeknight (5PM-9AM) or Weekend/Holiday: Check EPIC Web Links tab & use AMION (password  [email or web address]) for General Surgery [facility] coverage  Please, DO NOT use SecureChat  (it is not reliable communication to reach operating surgeons & will lead to a delay in care).   Epic staff messaging available for outptient concerns needing 1-2 business day response.      10/25/2023, 8:11 AM

## 2023-10-25 NOTE — Discharge Instructions (Signed)
 ################################################################  LAPAROSCOPIC SURGERY: POST OP INSTRUCTIONS  ######################################################################  EAT Gradually transition to a high fiber diet with a fiber supplement over the next few weeks after discharge.  Start with a pureed / full liquid diet (see below)  WALK Walk an hour a day.  Control your pain to do that.    CONTROL PAIN Control pain so that you can walk, sleep, tolerate sneezing/coughing, go up/down stairs.  HAVE A BOWEL MOVEMENT DAILY Keep your bowels regular to avoid problems.  OK to try a laxative to override constipation.  OK to use an antidairrheal to slow down diarrhea.  Call if not better after 2 tries  CALL IF YOU HAVE PROBLEMS/CONCERNS Call if you are still struggling despite following these instructions. Call if you have concerns not answered by these instructions  ######################################################################    DIET: Follow a light bland diet & liquids the first 24 hours after arrival home, such as soup, liquids, starches, etc.  Be sure to drink plenty of fluids.  Quickly advance to a usual solid diet within a few days.  Avoid fast food or heavy meals as your are more likely to get nauseated or have irregular bowels.  A low-fat, high-fiber diet for the rest of your life is ideal.  Take your usually prescribed home medications unless otherwise directed. Blood thinners:  You can restart any strong blood thinners after the second postoperative day  for example: COUMADIN (warfarin), XERELTO (rivaroxaban), ELIQUIS (apixaban), PLAVIX (clopidigrel), BRILINTA (ticagrelor), EFFIENT (prasugrel), PRADAXA (dabigatran), etc  Continue aspirin before & after surgery..     Some oozing/bleeding the first 1-2 weeks is common but should taper down & be small volume.    If you are passing many large clots or having uncontrolling bleeding, call your surgeon  PAIN  CONTROL: Pain is best controlled by a usual combination of three different methods TOGETHER: Ice/Heat Over the counter pain medication Prescription pain medication Most patients will experience some swelling and bruising around the incisions.  Ice packs or heating pads (30-60 minutes up to 6 times a day) will help. Use ice for the first few days to help decrease swelling and bruising, then switch to heat to help relax tight/sore spots and speed recovery.  Some people prefer to use ice alone, heat alone, alternating between ice & heat.  Experiment to what works for you.  Swelling and bruising can take several weeks to resolve.   It is helpful to take an over-the-counter pain medication regularly for the first few weeks.  Choose one of the following that works best for you: Naproxen  (Aleve , etc)  Two 220mg  tabs twice a day Ibuprofen (Advil, etc) Three 200mg  tabs four times a day (every meal & bedtime) Acetaminophen  (Tylenol , etc) 500-650mg  four times a day (every meal & bedtime) A  prescription for pain medication (such as oxycodone , hydrocodone, tramadol , gabapentin , methocarbamol , etc) should be given to you upon discharge.  Take your pain medication as prescribed.  If you are having problems/concerns with the prescription medicine (does not control pain, nausea, vomiting, rash, itching, etc), please call us  [phone] to see if we need to switch you to a different pain medicine that will work better for you and/or control your side effect better. If you need a refill on your pain medication, please give us  48 hour notice.  contact your pharmacy.  They will contact our office to request authorization. Prescriptions will not be filled after 5 pm or on week-ends  AVOID GETTING CONSTIPATED.   a.  Between the surgery and the pain medications, it is common to experience some constipation.  b.  Drink plenty of liquids c   ake a fiber supplement 2 times day (such as Metamucil, Citrucel, FiberCon,  MiraLax , etc) to have a bowel movement every day. d.  If you have not had a BM by 2 days after surgery: -drink liquids only until you have a bowel movement - take MiraLAX  2 doses every 2 hours until you have a bowel movement   Watch out for diarrhea.   If you have many loose bowel movements, simplify your diet to bland foods & liquids for a few days.   Stop any stool softeners and decrease your fiber supplement.   Switching to mild anti-diarrheal medications (Kayopectate, Pepto Bismol) can help.   If this worsens or does not improve, please call us .  Wash / shower every day.  You may shower over the dressings as they are waterproof.  Continue to shower over incision(s) after the dressing is off.  It is good for closed incisions and even open wounds to be washed every day.  Shower every day.  Short baths are fine.  Wash the incisions and wounds clean with soap & water.   You may leave closed incisions open to air if it is dry.   You may cover the incision with clean gauze & replace it after your daily shower for comfort.      ACTIVITIES as tolerated:   You may resume regular (light) daily activities beginning the next day--such as daily self-care, walking, climbing stairs--gradually increasing activities as tolerated.  If you can walk 30 minutes without difficulty, it is safe to try more intense activity such as jogging, treadmill, bicycling, low-impact aerobics, swimming, etc. Save the most intensive and strenuous activity for last such as sit-ups, heavy lifting, contact sports, etc  Refrain from any heavy lifting or straining until you are off narcotics for pain control.   DO NOT PUSH THROUGH PAIN.  Let pain be your guide: If it hurts to do something, don't do it.  Pain is your body warning you to avoid that activity for another week until the pain goes down. You may drive when you are no longer taking prescription pain medication, you can comfortably wear a seatbelt, and you can safely  maneuver your car and apply brakes. You may have sexual intercourse when it is comfortable.  FOLLOW UP in our office Please call [facility] at [phone] to set up an appointment to see your surgeon in the office for a follow-up appointment approximately 2-3 weeks after your surgery. Make sure that you call for this appointment the day you arrive home to insure a convenient appointment time.  10. IF YOU HAVE DISABILITY OR FAMILY LEAVE FORMS, BRING THEM TO THE OFFICE FOR PROCESSING.  DO NOT GIVE THEM TO YOUR DOCTOR.   WHEN TO CALL US  [phone]: Poor pain control Reactions / problems with new medications (rash/itching, nausea, etc)  Fever over 101.5 F (38.5 C) Inability to urinate Nausea and/or vomiting Worsening swelling or bruising Continued bleeding from incision. Increased pain, redness, or drainage from the incision   The clinic staff is available to answer your questions during regular business hours (8:30am-5pm).  Please don't hesitate to call and ask to speak to one of our nurses for clinical concerns.   If you have a medical emergency, go to the nearest emergency room or call 911.  A surgeon from [facility] is always on call  at the [facility], [place] [address], [address], [place], [place]  [address] ? MAIN: [phone] ? TOLL FREE: [phone] ?  FAX [phone] [email or web address]  ##############################################################

## 2023-10-31 DIAGNOSIS — K828 Other specified diseases of gallbladder: Secondary | ICD-10-CM | POA: Diagnosis not present

## 2023-10-31 DIAGNOSIS — G8918 Other acute postprocedural pain: Secondary | ICD-10-CM | POA: Diagnosis not present

## 2023-11-17 DIAGNOSIS — R748 Abnormal levels of other serum enzymes: Secondary | ICD-10-CM | POA: Diagnosis not present

## 2023-12-01 DIAGNOSIS — K589 Irritable bowel syndrome without diarrhea: Secondary | ICD-10-CM | POA: Diagnosis not present

## 2023-12-01 DIAGNOSIS — R1011 Right upper quadrant pain: Secondary | ICD-10-CM | POA: Diagnosis not present

## 2023-12-01 DIAGNOSIS — R110 Nausea: Secondary | ICD-10-CM | POA: Diagnosis not present

## 2024-01-23 DIAGNOSIS — R1011 Right upper quadrant pain: Secondary | ICD-10-CM | POA: Diagnosis not present

## 2024-01-23 DIAGNOSIS — K589 Irritable bowel syndrome without diarrhea: Secondary | ICD-10-CM | POA: Diagnosis not present

## 2024-01-23 DIAGNOSIS — R110 Nausea: Secondary | ICD-10-CM | POA: Diagnosis not present

## 2024-01-23 DIAGNOSIS — Z9049 Acquired absence of other specified parts of digestive tract: Secondary | ICD-10-CM | POA: Diagnosis not present
